# Patient Record
Sex: Male | Born: 1960
Health system: Southern US, Community
[De-identification: ages and names within clinical notes are randomized; demographics above are authoritative.]

## PROBLEM LIST (undated history)

## (undated) DIAGNOSIS — F329 Major depressive disorder, single episode, unspecified: Secondary | ICD-10-CM

## (undated) DIAGNOSIS — B1920 Unspecified viral hepatitis C without hepatic coma: Secondary | ICD-10-CM

## (undated) DIAGNOSIS — K219 Gastro-esophageal reflux disease without esophagitis: Secondary | ICD-10-CM

## (undated) DIAGNOSIS — F32A Depression, unspecified: Secondary | ICD-10-CM

## (undated) HISTORY — PX: KNEE SURGERY: SHX244

## (undated) HISTORY — PX: FOOT SURGERY: SHX648

---

## 2010-01-10 ENCOUNTER — Ambulatory Visit: Payer: Self-pay | Admitting: Diagnostic Radiology

## 2010-01-10 ENCOUNTER — Emergency Department (HOSPITAL_BASED_OUTPATIENT_CLINIC_OR_DEPARTMENT_OTHER): Admission: EM | Admit: 2010-01-10 | Discharge: 2010-01-10 | Payer: Self-pay | Admitting: Emergency Medicine

## 2010-01-19 ENCOUNTER — Emergency Department (HOSPITAL_COMMUNITY): Admission: EM | Admit: 2010-01-19 | Discharge: 2010-01-20 | Payer: Self-pay | Admitting: Emergency Medicine

## 2010-01-20 ENCOUNTER — Inpatient Hospital Stay (HOSPITAL_COMMUNITY)
Admission: EM | Admit: 2010-01-20 | Discharge: 2010-01-26 | Payer: Self-pay | Source: Home / Self Care | Admitting: Psychiatry

## 2010-01-20 ENCOUNTER — Ambulatory Visit: Payer: Self-pay | Admitting: Psychiatry

## 2010-01-20 DIAGNOSIS — F1910 Other psychoactive substance abuse, uncomplicated: Secondary | ICD-10-CM

## 2010-01-20 DIAGNOSIS — F1010 Alcohol abuse, uncomplicated: Secondary | ICD-10-CM

## 2010-05-11 LAB — DIFFERENTIAL
Eosinophils Relative: 2 % (ref 0–5)
Lymphocytes Relative: 36 % (ref 12–46)
Lymphs Abs: 3.5 10*3/uL (ref 0.7–4.0)
Monocytes Absolute: 0.7 10*3/uL (ref 0.1–1.0)

## 2010-05-11 LAB — POCT I-STAT, CHEM 8
BUN: 4 mg/dL — ABNORMAL LOW (ref 6–23)
Creatinine, Ser: 1.7 mg/dL — ABNORMAL HIGH (ref 0.4–1.5)
Sodium: 145 mEq/L (ref 135–145)
TCO2: 32 mmol/L (ref 0–100)

## 2010-05-11 LAB — CBC
HCT: 45.9 % (ref 39.0–52.0)
MCV: 95.7 fL (ref 78.0–100.0)
Platelets: 201 10*3/uL (ref 150–400)
RBC: 4.8 MIL/uL (ref 4.22–5.81)
WBC: 9.7 10*3/uL (ref 4.0–10.5)

## 2010-05-11 LAB — RAPID URINE DRUG SCREEN, HOSP PERFORMED: Barbiturates: NOT DETECTED

## 2010-05-11 LAB — HEPATIC FUNCTION PANEL
ALT: 30 U/L (ref 0–53)
AST: 32 U/L (ref 0–37)
Albumin: 3.5 g/dL (ref 3.5–5.2)
Bilirubin, Direct: 0.1 mg/dL (ref 0.0–0.3)

## 2010-05-11 LAB — RPR: RPR Ser Ql: NONREACTIVE

## 2012-01-23 ENCOUNTER — Observation Stay (HOSPITAL_BASED_OUTPATIENT_CLINIC_OR_DEPARTMENT_OTHER)
Admission: EM | Admit: 2012-01-23 | Discharge: 2012-01-24 | Disposition: A | Discharge status: Rehabilitation Facility | Payer: Self-pay | Attending: Emergency Medicine | Admitting: Emergency Medicine

## 2012-01-23 ENCOUNTER — Emergency Department (HOSPITAL_BASED_OUTPATIENT_CLINIC_OR_DEPARTMENT_OTHER): Payer: Self-pay

## 2012-01-23 ENCOUNTER — Encounter (HOSPITAL_BASED_OUTPATIENT_CLINIC_OR_DEPARTMENT_OTHER): Payer: Self-pay

## 2012-01-23 DIAGNOSIS — Z8701 Personal history of pneumonia (recurrent): Secondary | ICD-10-CM | POA: Insufficient documentation

## 2012-01-23 DIAGNOSIS — R911 Solitary pulmonary nodule: Secondary | ICD-10-CM | POA: Insufficient documentation

## 2012-01-23 DIAGNOSIS — R0602 Shortness of breath: Secondary | ICD-10-CM | POA: Insufficient documentation

## 2012-01-23 DIAGNOSIS — R079 Chest pain, unspecified: Secondary | ICD-10-CM

## 2012-01-23 DIAGNOSIS — Z79899 Other long term (current) drug therapy: Secondary | ICD-10-CM | POA: Insufficient documentation

## 2012-01-23 DIAGNOSIS — F17200 Nicotine dependence, unspecified, uncomplicated: Secondary | ICD-10-CM | POA: Insufficient documentation

## 2012-01-23 DIAGNOSIS — R0789 Other chest pain: Principal | ICD-10-CM | POA: Insufficient documentation

## 2012-01-23 DIAGNOSIS — R61 Generalized hyperhidrosis: Secondary | ICD-10-CM | POA: Insufficient documentation

## 2012-01-23 LAB — CBC WITH DIFFERENTIAL/PLATELET
Basophils Absolute: 0 10*3/uL (ref 0.0–0.1)
Basophils Relative: 0 % (ref 0–1)
Basophils Relative: 1 % (ref 0–1)
Eosinophils Absolute: 0.1 10*3/uL (ref 0.0–0.7)
Eosinophils Relative: 3 % (ref 0–5)
HCT: 44.7 % (ref 39.0–52.0)
Hemoglobin: 15.6 g/dL (ref 13.0–17.0)
Lymphocytes Relative: 6 % — ABNORMAL LOW (ref 12–46)
Lymphs Abs: 1.6 10*3/uL (ref 0.7–4.0)
MCH: 32 pg (ref 26.0–34.0)
MCHC: 33.3 g/dL (ref 30.0–36.0)
MCV: 78.1 fL (ref 78.0–100.0)
MCV: 96.1 fL (ref 78.0–100.0)
Monocytes Relative: 13 % — ABNORMAL HIGH (ref 3–12)
Neutro Abs: 6.9 10*3/uL (ref 1.7–7.7)
Neutrophils Relative %: 47 % (ref 43–77)
Neutrophils Relative %: 86 % — ABNORMAL HIGH (ref 43–77)
Platelets: 166 10*3/uL (ref 150–400)
RBC: 4.37 MIL/uL (ref 4.22–5.81)
RBC: 5.72 MIL/uL (ref 4.22–5.81)
RDW: 13.3 % (ref 11.5–15.5)
WBC: 8 10*3/uL (ref 4.0–10.5)

## 2012-01-23 LAB — COMPREHENSIVE METABOLIC PANEL
ALT: 13 U/L (ref 0–53)
AST: 20 U/L (ref 0–37)
Albumin: 3.8 g/dL (ref 3.5–5.2)
Albumin: 4.2 g/dL (ref 3.5–5.2)
BUN: 14 mg/dL (ref 6–23)
CO2: 26 mEq/L (ref 19–32)
Calcium: 9.5 mg/dL (ref 8.4–10.5)
Chloride: 105 mEq/L (ref 96–112)
GFR calc Af Amer: >90 mL/min (ref >90)
GFR calc non Af Amer: 69 mL/min — ABNORMAL LOW (ref >90)
Glucose, Bld: 102 mg/dL — ABNORMAL HIGH (ref 70–99)
Sodium: 141 mEq/L (ref 135–145)
Total Bilirubin: 0.4 mg/dL (ref 0.3–1.2)
Total Protein: 7.6 g/dL (ref 6.0–8.3)

## 2012-01-23 LAB — POCT I-STAT TROPONIN I
Troponin i, poc: 0 ng/mL (ref 0.00–0.08)
Troponin i, poc: 0 ng/mL (ref 0.00–0.08)

## 2012-01-23 LAB — TROPONIN I: Troponin I: <0.3 ng/mL (ref <0.30)

## 2012-01-23 MED ORDER — NICOTINE 21 MG/24HR TD PT24
21.0000 mg | MEDICATED_PATCH | Freq: Once | TRANSDERMAL | Status: AC
  Administered 2012-01-23: 21 mg via TRANSDERMAL
  Filled 2012-01-23: qty 1

## 2012-01-23 MED ORDER — ASPIRIN 81 MG PO CHEW
324.0000 mg | CHEWABLE_TABLET | Freq: Once | ORAL | Status: AC
  Administered 2012-01-23: 324 mg via ORAL
  Filled 2012-01-23: qty 4

## 2012-01-23 MED ORDER — NITROGLYCERIN 0.4 MG SL SUBL
0.4000 mg | SUBLINGUAL_TABLET | SUBLINGUAL | Status: DC | PRN
  Administered 2012-01-23: 0.4 mg via SUBLINGUAL
  Filled 2012-01-23: qty 25

## 2012-01-23 MED ORDER — TRAZODONE HCL 50 MG PO TABS
50.0000 mg | ORAL_TABLET | Freq: Every day | ORAL | Status: DC
  Administered 2012-01-23: 50 mg via ORAL
  Filled 2012-01-23: qty 1

## 2012-01-23 NOTE — ED Notes (Signed)
Report given to Alvino Chapel, RN [facility] CDU Room 12.

## 2012-01-23 NOTE — ED Notes (Signed)
Pt reports he was awakened with chest pain radiating to left arm and back.  He also reports SHOB and diaphoresis.

## 2012-01-23 NOTE — ED Notes (Signed)
Chart reviewed.

## 2012-01-23 NOTE — ED Notes (Signed)
Blood drawn on arrival to the ed

## 2012-01-23 NOTE — ED Notes (Signed)
The pt just arrived from med center where he has been since 0800am today. No chest pain since he received nitro sl at 100am today.  He had some fleeting chest pain last week that did not last very long and he came in today for chest pain more frequent and   Lasting longer.  On arival here vital good no chest pain .  He wants food and he wants to walk around for a few minutes to stretch his legs.  He has called the facility he is currently living and gave them his location.

## 2012-01-23 NOTE — ED Notes (Signed)
Called EMS to transport patient to [facility] CDU

## 2012-01-23 NOTE — ED Notes (Signed)
Dinner tray has arrived 

## 2012-01-23 NOTE — ED Provider Notes (Signed)
Assumed care of patient in the CDU.  Patient transferred from [facility] for Chest Pain Protocol.  Plan is for the patient to have a Stress Echo in the morning.   Patient began having chest pain last evening, which has been intermittent.  Pain associated with some SOB and diaphoresis.  No prior cardiac history.  No ischemic changes on EKG.  Initial and 3 hour Troponin are negative.  CXR did show a pulmonary nodule.  Discussed with patient and the recommendation for outpatient follow up.  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  He reports that he was treated with TB medications for 6 months.  Reassessed patient. Patient denies any chest pain at this time.  Patient alert and orientated x 3, Heart RRR, Lungs CTAB, Abdomen soft and nontender, No peripheral edema, DP 2+ bilaterally    8:00 PM Reassessed patient.  No acute distress.  No Chest Pain at this time.  11:59 PM Reassessed patient.  Patient is currently sleeping.  Patient will be signed out to Dr. Weldon Inches at shift change.  Plan is for the patient to have a Stress Echo in the morning.    Pascal Lux Clayton, PA-C 01/24/12 0002

## 2012-01-23 NOTE — ED Notes (Signed)
[place] sandwich given.  No chest pain.  Hot meal requested

## 2012-01-23 NOTE — ED Notes (Signed)
[facility] at bedside preparing for transport.  Pt stable upon transfer. 

## 2012-01-23 NOTE — ED Notes (Signed)
[facility]- notified for transfer to CDU--dr. Clarene Duke is receiving

## 2012-01-23 NOTE — ED Notes (Signed)
Pt watching tv.  No chest pain.  He reports a fleeting chest pain just awhile ago but it went away

## 2012-01-23 NOTE — ED Notes (Signed)
Pt resting comfortably in bed. Denies pain, SOB, diaphoresis.

## 2012-01-23 NOTE — ED Notes (Signed)
The pt has no chest pain decaf coffee given waiting for dinner tray

## 2012-01-23 NOTE — ED Notes (Signed)
Correction to the above statement.  His nitro was 1100am today

## 2012-01-23 NOTE — ED Notes (Signed)
Patient transported to X-ray via stretcher 

## 2012-01-23 NOTE — ED Notes (Signed)
MD at bedside. 

## 2012-01-23 NOTE — ED Notes (Signed)
Patient back from  X-ray 

## 2012-01-23 NOTE — ED Provider Notes (Signed)
History     CSN: [id number]  Arrival date & time 01/23/12  4098   First MD Initiated Contact with Patient 01/23/12 [phone]      Chief Complaint  Patient presents with  . Chest Pain    (Consider location/radiation/quality/duration/timing/severity/associated sxs/prior treatment) HPI Comments: Patient presents with intermittent chest pain that started last night. He states that the pain has been off and on since last night and has been waking him up from sleep. Describes it as a tightness to the left side of his chest radiating to his left arm. He's had some intermittent shortness of breath and diaphoresis associated with it. He denies any nausea or vomiting. He denies a history of heart problems. He did have a stress test in the past but it's been several years ago. This was done when he had some chest pain associated with the pneumonia. Denies any other history of heart problems. Denies a family history of heart disease. He does smoke cigarettes and has a history of alcohol and substance abuse. 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 He denies any exertional symptoms. Denies any pleuritic-type pain. He did recently start Wellbutrin approximately one week ago.  Patient is a 51 y.o. male presenting with chest pain.  Chest Pain Primary symptoms include shortness of breath. Pertinent negatives for primary symptoms include no fever, no fatigue, no cough, no abdominal pain, no nausea, no vomiting and no dizziness.  Associated symptoms include diaphoresis.  Pertinent negatives for associated symptoms include no numbness and no weakness.     History reviewed. No pertinent past medical history.  Past Surgical History  Procedure Date  . Knee surgery     No family history on file.  History  Substance Use Topics  . Smoking status: Current Every Day Smoker -- 1.0 packs/day  . Smokeless tobacco: Not on file  . Alcohol Use: Yes     Comment:  in recovery- last drink was 15 days ago      Review of Systems  Constitutional: Positive for diaphoresis. Negative for fever, chills and fatigue.  HENT: Negative for congestion, rhinorrhea and sneezing.   Eyes: Negative.   Respiratory: Positive for shortness of breath. Negative for cough and chest tightness.   Cardiovascular: Positive for chest pain. Negative for leg swelling.  Gastrointestinal: Negative for nausea, vomiting, abdominal pain, diarrhea and blood in stool.  Genitourinary: Negative for frequency, hematuria, flank pain and difficulty urinating.  Musculoskeletal: Negative for back pain and arthralgias.  Skin: Negative for rash.  Neurological: Negative for dizziness, speech difficulty, weakness, numbness and headaches.    Allergies  Review of patient's allergies indicates no known allergies.  Home Medications   Current Outpatient Rx  Name  Route  Sig  Dispense  Refill  . BUPROPION HCL ER (XL) 150 MG PO TB24   Oral   Take 150 mg by mouth daily.         Marland Kitchen OMEPRAZOLE 20 MG PO CPDR   Oral   Take 20 mg by mouth daily.         . TRAZODONE HCL PO   Oral   Take 50 mg by mouth.           BP 110/67  Pulse 87  Resp 12  Ht 5\' 9"  (1.753 m)  Wt 160 lb (72.576 kg)  BMI 23.63 kg/m2  SpO2 99%  Physical Exam  Constitutional: He is oriented to person, place, and time. He appears well-developed and  well-nourished.  HENT:  Head: Normocephalic and atraumatic.  Eyes: Pupils are equal, round, and reactive to light.  Neck: Normal range of motion. Neck supple.  Cardiovascular: Normal rate, regular rhythm and normal heart sounds.   Pulmonary/Chest: Effort normal and breath sounds normal. No respiratory distress. He has no wheezes. He has no rales. He exhibits no tenderness.  Abdominal: Soft. Bowel sounds are normal. There is no tenderness. There is no rebound and no guarding.  Musculoskeletal: Normal range of motion. He exhibits no edema and no tenderness.    Lymphadenopathy:    He has no cervical adenopathy.  Neurological: He is alert and oriented to person, place, and time.  Skin: Skin is warm and dry. No rash noted.  Psychiatric: He has a normal mood and affect.    ED Course  Procedures (including critical care time)   Date: 01/23/2012  Rate: 96  Rhythm: normal sinus rhythm  QRS Axis: normal  Intervals: normal  ST/T Wave abnormalities: normal  Conduction Disutrbances:none  Narrative Interpretation:   Old EKG Reviewed: none available    Results for orders placed during the hospital encounter of 01/23/12  CBC WITH DIFFERENTIAL      Component Value Range   WBC 8.0  4.0 - 10.5 K/uL   RBC 5.72  4.22 - 5.81 MIL/uL   Hemoglobin 15.6  13.0 - 17.0 g/dL   HCT 16.1  09.6 - 04.5 %   MCV 78.1  78.0 - 100.0 fL   MCH 27.3  26.0 - 34.0 pg   MCHC 34.9  30.0 - 36.0 g/dL   RDW 40.9  81.1 - 91.4 %   Platelets 238  150 - 400 K/uL   Neutrophils Relative 86 (*) 43 - 77 %   Neutro Abs 6.9  1.7 - 7.7 K/uL   Lymphocytes Relative 6 (*) 12 - 46 %   Lymphs Abs 0.5 (*) 0.7 - 4.0 K/uL   Monocytes Relative 6  3 - 12 %   Monocytes Absolute 0.5  0.1 - 1.0 K/uL   Eosinophils Relative 2  0 - 5 %   Eosinophils Absolute 0.2  0.0 - 0.7 K/uL   Basophils Relative 0  0 - 1 %   Basophils Absolute 0.0  0.0 - 0.1 K/uL  COMPREHENSIVE METABOLIC PANEL      Component Value Range   Sodium 141  135 - 145 mEq/L   Potassium 4.6  3.5 - 5.1 mEq/L   Chloride 105  96 - 112 mEq/L   CO2 26  19 - 32 mEq/L   Glucose, Bld 103 (*) 70 - 99 mg/dL   BUN 13  6 - 23 mg/dL   Creatinine, Ser 7.82  0.50 - 1.35 mg/dL   Calcium 9.5  8.4 - 95.6 mg/dL   Total Protein 7.6  6.0 - 8.3 g/dL   Albumin 4.2  3.5 - 5.2 g/dL   AST 20  0 - 37 U/L   ALT 13  0 - 53 U/L   Alkaline Phosphatase 64  39 - 117 U/L   Total Bilirubin 0.4  0.3 - 1.2 mg/dL   GFR calc non Af Amer 69 (*) >90 mL/min   GFR calc Af Amer 80 (*) >90 mL/min  TROPONIN I      Component Value Range   Troponin I <0.30  <0.30  ng/mL   Dg Chest 2 View  01/23/2012  *RADIOLOGY REPORT*  Clinical Data: Chest pain.  CHEST - 2 VIEW  Comparison: None.  Findings: Cardiomediastinal silhouette  appears normal.  Right lung is clear.  Ill-defined nodular density is noted and lateral portion of left upper lobe; CT scan of chest is recommended for further evaluation.  IMPRESSION: Ill-defined nodular density is seen in left upper lobe; CT scan of chest is recommended for further evaluation and to rule out potential neoplasm.   Original Report Authenticated By: Lupita Raider.,  M.D.       1. Chest pain   2. Lung nodule       MDM  Pt pain free after nitro.  Given ASA.  No ekg changes, first trop negative.  Will transfer to CDU for chest pain protocol.  Discussed with Dr. Clarene Duke and MLP in CDU.  Will need outpt f/u of lung nodule.        Rolan Bucco, MD 01/23/12 1057

## 2012-01-23 NOTE — ED Notes (Signed)
Report called to [facility], RN 

## 2012-01-23 NOTE — ED Notes (Signed)
Cancelled the call to EMS for transfer

## 2012-01-24 DIAGNOSIS — R072 Precordial pain: Secondary | ICD-10-CM

## 2012-01-24 MED ORDER — PANTOPRAZOLE SODIUM 20 MG PO TBEC: 20.0000 mg | DELAYED_RELEASE_TABLET | Freq: Every day | ORAL | Status: DC

## 2012-01-24 MED ORDER — BUPROPION HCL ER (XL) 150 MG PO TB24
150.0000 mg | ORAL_TABLET | Freq: Every day | ORAL | Status: DC
  Administered 2012-01-24: 150 mg via ORAL
  Filled 2012-01-24: qty 1

## 2012-01-24 MED ORDER — PANTOPRAZOLE SODIUM 40 MG PO TBEC
40.0000 mg | DELAYED_RELEASE_TABLET | Freq: Every day | ORAL | Status: DC
  Administered 2012-01-24: 40 mg via ORAL
  Filled 2012-01-24: qty 1

## 2012-01-24 NOTE — ED Provider Notes (Signed)
Patient moved to CDU under chest pain protocol.  While in obeservation over night the pt slept well and had no complaints, per nursing staff.  Patient resting comfortably at present without return of chest pain. Lungs CTA bilaterally. S1/S2, RRR, no murmur. Abdomen soft, bowel sounds present. Strong distal pulses palpated all extremities. Sinus rhythm on monitor without ectopy. Troponin negative x 2. 12 lead reviewed, no indication of ischemia. Patient scheduled for Stress Echo this AM. Diagnostic and treatment plan discussed with patient.  10:50 AM Stress echo results: Left ventricular ejection fraction was normal at rest and with stress. Normal echo stress.  I discussed these findings with the patient    Patient to be discharged home with followup with [facility].  Strict return instructions given if chest pain returns.  I have also discussed reasons to return immediately to the ER.  Patient expresses understanding and agrees with plan.  1. Medications: protonix 2. Treatment: rest, drink plenty of fluids 3. Follow Up: with [facility] this week  Dierdre Forth, PA-C 01/24/12 1610

## 2012-01-24 NOTE — Progress Notes (Signed)
  Echocardiogram Echocardiogram Stress Test has been performed.  Caleb Dyer 01/24/2012, 9:26 AM

## 2012-01-24 NOTE — ED Notes (Signed)
SPOKE WITH NURSE AT [facility] AND SHE ADVISES IT IS OK FOR PT TO GO SIT IN THE LOBBY WHILE HE IS WAITING FOR THEIR STAFF TO COME PICK HIM UP

## 2012-01-25 NOTE — ED Provider Notes (Signed)
Pt sent by [facility] EDP to cdu on cp protocol.   Suzi Roots, MD 01/25/12 [phone]

## 2012-01-25 NOTE — ED Provider Notes (Signed)
Pt seen by PAC under obs protocol.  I was available for consultation or discussion.  Caleb Dyer. Oletta Lamas, MD 01/25/12 [phone]

## 2012-02-19 ENCOUNTER — Emergency Department (HOSPITAL_BASED_OUTPATIENT_CLINIC_OR_DEPARTMENT_OTHER)
Admission: EM | Admit: 2012-02-19 | Discharge: 2012-02-19 | Disposition: A | Discharge status: Home / Self Care | Payer: Self-pay | Attending: Emergency Medicine | Admitting: Emergency Medicine

## 2012-02-19 ENCOUNTER — Encounter (HOSPITAL_BASED_OUTPATIENT_CLINIC_OR_DEPARTMENT_OTHER): Payer: Self-pay | Admitting: *Deleted

## 2012-02-19 DIAGNOSIS — F17200 Nicotine dependence, unspecified, uncomplicated: Secondary | ICD-10-CM | POA: Insufficient documentation

## 2012-02-19 DIAGNOSIS — H5789 Other specified disorders of eye and adnexa: Secondary | ICD-10-CM | POA: Insufficient documentation

## 2012-02-19 DIAGNOSIS — H109 Unspecified conjunctivitis: Secondary | ICD-10-CM | POA: Insufficient documentation

## 2012-02-19 MED ORDER — SULFACETAMIDE SODIUM 10 % OP SOLN: 2.0000 [drp] | OPHTHALMIC | Status: DC

## 2012-02-19 MED ORDER — SULFACETAMIDE SODIUM 10 % OP SOLN: 2.0000 [drp] | Freq: Four times a day (QID) | OPHTHALMIC | Status: DC

## 2012-02-19 MED ORDER — FLUORESCEIN SODIUM 1 MG OP STRP
ORAL_STRIP | OPHTHALMIC | Status: AC
  Administered 2012-02-19: 20:00:00
  Filled 2012-02-19: qty 1

## 2012-02-19 MED ORDER — TETRACAINE HCL 0.5 % OP SOLN
OPHTHALMIC | Status: AC
  Administered 2012-02-19: 20:00:00
  Filled 2012-02-19: qty 2

## 2012-02-19 NOTE — ED Notes (Signed)
Right eye pain since Friday. "Felt like something was in eye." Now red and puffy.

## 2012-02-19 NOTE — ED Notes (Signed)
rx x 1 given at d/c-

## 2012-02-19 NOTE — ED Provider Notes (Signed)
History  This chart was scribed for Geoffery Lyons, MD by Shari Heritage, ED Scribe. The patient was seen in room MH12/MH12. Patient's care was started at 18.  CSN: [id number]  Arrival date & time 02/19/12  4098   First MD Initiated Contact with Patient 02/19/12 1908      Chief Complaint  Patient presents with  . Eye Pain    The history is provided by the patient. No language interpreter was used.    HPI Comments: Caleb Dyer is a 51 y.o. male who presents to the Emergency Department complaining of gradually worsening, constant, non-radiating right eye pain onset 2 days ago. Patient has noticed some matting in the mornings to the right eye. There is also associated redness and swelling to the right eye. Patient wears reading glasses. He denies any injuries or traumas to the eye or head. Patient denies photophobia or vision changes. He denies any sick contacts. 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 Patient is a current every day smokers.    History reviewed. No pertinent past medical history.  Past Surgical History  Procedure Date  . Knee surgery     History reviewed. No pertinent family history.  History  Substance Use Topics  . Smoking status: Current Every Day Smoker -- 1.0 packs/day  . Smokeless tobacco: Not on file  . Alcohol Use: Yes     Comment: in recovery- last drink was 15 days ago      Review of Systems  Eyes: Positive for pain and redness. Negative for photophobia and visual disturbance.  All other systems reviewed and are negative.    Allergies  Review of patient's allergies indicates no known allergies.  Home Medications   Current Outpatient Rx  Name  Route  Sig  Dispense  Refill  . BUPROPION HCL ER (XL) 150 MG PO TB24   Oral   Take 150 mg by mouth daily.         Marland Kitchen OMEPRAZOLE 20 MG PO CPDR   Oral   Take 20 mg by mouth daily.         Marland Kitchen PANTOPRAZOLE SODIUM 20 MG PO  TBEC   Oral   Take 1 tablet (20 mg total) by mouth daily.   30 tablet   0   . TRAZODONE HCL PO   Oral   Take 50 mg by mouth daily as needed. sleep           Triage Vitals: BP 113/72  Pulse 94  Temp 99.6 F (37.6 C) (Oral)  Resp 20  Ht 5\' 9"  (1.753 m)  Wt 160 lb (72.576 kg)  BMI 23.63 kg/m2  SpO2 98%  Physical Exam  Constitutional: He is oriented to person, place, and time. He appears well-developed and well-nourished. No distress.  HENT:  Head: Normocephalic and atraumatic.  Eyes: EOM are normal. Pupils are equal, round, and reactive to light. Right conjunctiva is injected.       Cornea is clear without abrasions to both visual inspection and fluorescein staining.   Neck: Neck supple.  Cardiovascular: Normal rate and regular rhythm.   No murmur heard. Pulmonary/Chest: Effort normal and breath sounds normal. No respiratory distress. He has no wheezes. He has no rales.  Abdominal: Soft. Bowel sounds are normal. He exhibits no distension. There is no tenderness.  Musculoskeletal: Normal range of motion. He exhibits no edema and no tenderness.  Neurological: He is alert and oriented  to person, place, and time.  Skin: Skin is warm and dry. No rash noted.    ED Course  Procedures (including critical care time) DIAGNOSTIC STUDIES: Oxygen Saturation is 98% on room air, normal by my interpretation.    COORDINATION OF CARE: 7:14 PM- Patient informed of current plan for treatment and evaluation and agrees with plan at this time.    No diagnosis found.    MDM  No abrasions seen with fluorescein and no fb's under the lids.  There is conjunctival injection and I believe this is conjunctivitis.  Will treat with bleph 10.  Return prn.      I personally performed the services described in this documentation, which was scribed in my presence. The recorded information has been reviewed and is accurate.      Geoffery Lyons, MD 02/19/12 2110

## 2012-03-19 ENCOUNTER — Encounter (HOSPITAL_BASED_OUTPATIENT_CLINIC_OR_DEPARTMENT_OTHER): Payer: Self-pay | Admitting: Emergency Medicine

## 2012-03-19 ENCOUNTER — Emergency Department (HOSPITAL_BASED_OUTPATIENT_CLINIC_OR_DEPARTMENT_OTHER)
Admission: EM | Admit: 2012-03-19 | Discharge: 2012-03-19 | Disposition: A | Discharge status: Rehabilitation Facility | Payer: Self-pay | Attending: Emergency Medicine | Admitting: Emergency Medicine

## 2012-03-19 DIAGNOSIS — F17200 Nicotine dependence, unspecified, uncomplicated: Secondary | ICD-10-CM | POA: Insufficient documentation

## 2012-03-19 DIAGNOSIS — F3289 Other specified depressive episodes: Secondary | ICD-10-CM | POA: Insufficient documentation

## 2012-03-19 DIAGNOSIS — F329 Major depressive disorder, single episode, unspecified: Secondary | ICD-10-CM | POA: Insufficient documentation

## 2012-03-19 DIAGNOSIS — K219 Gastro-esophageal reflux disease without esophagitis: Secondary | ICD-10-CM | POA: Insufficient documentation

## 2012-03-19 DIAGNOSIS — Z8619 Personal history of other infectious and parasitic diseases: Secondary | ICD-10-CM | POA: Insufficient documentation

## 2012-03-19 DIAGNOSIS — Z79899 Other long term (current) drug therapy: Secondary | ICD-10-CM | POA: Insufficient documentation

## 2012-03-19 DIAGNOSIS — F1010 Alcohol abuse, uncomplicated: Secondary | ICD-10-CM | POA: Insufficient documentation

## 2012-03-19 DIAGNOSIS — J111 Influenza due to unidentified influenza virus with other respiratory manifestations: Secondary | ICD-10-CM | POA: Insufficient documentation

## 2012-03-19 DIAGNOSIS — R509 Fever, unspecified: Secondary | ICD-10-CM | POA: Insufficient documentation

## 2012-03-19 DIAGNOSIS — F1110 Opioid abuse, uncomplicated: Secondary | ICD-10-CM | POA: Insufficient documentation

## 2012-03-19 HISTORY — DX: Major depressive disorder, single episode, unspecified: F32.9

## 2012-03-19 HISTORY — DX: Gastro-esophageal reflux disease without esophagitis: K21.9

## 2012-03-19 HISTORY — DX: Depression, unspecified: F32.A

## 2012-03-19 MED ORDER — OSELTAMIVIR PHOSPHATE 75 MG PO CAPS: 75.0000 mg | ORAL_CAPSULE | Freq: Two times a day (BID) | ORAL | Status: DC

## 2012-03-19 MED ORDER — SALINE SPRAY 0.65 % NA SOLN
1.0000 | Freq: Once | NASAL | Status: AC
  Administered 2012-03-19: 1 via NASAL
  Filled 2012-03-19: qty 44

## 2012-03-19 MED ORDER — OXYMETAZOLINE HCL 0.05 % NA SOLN: 2.0000 | Freq: Two times a day (BID) | NASAL | Status: DC

## 2012-03-19 MED ORDER — IBUPROFEN 400 MG PO TABS
600.0000 mg | ORAL_TABLET | Freq: Once | ORAL | Status: AC
  Administered 2012-03-19: 600 mg via ORAL
  Filled 2012-03-19: qty 1

## 2012-03-19 MED ORDER — SALINE NASAL SPRAY 0.65 % NA SOLN: 1.0000 | NASAL | Status: DC | PRN

## 2012-03-19 MED ORDER — IBUPROFEN 600 MG PO TABS: 600.0000 mg | ORAL_TABLET | Freq: Four times a day (QID) | ORAL | Status: DC | PRN

## 2012-03-19 NOTE — ED Provider Notes (Signed)
History     CSN: [id number]  Arrival date & time 03/19/12  0900   First MD Initiated Contact with Patient 03/19/12 0920      Chief Complaint  Patient presents with  . Cough  . Fever    (Consider location/radiation/quality/duration/timing/severity/associated sxs/prior treatment) HPI Comments: Pt comes from [facility] facility, where he is rehabiliting for heroine and alcohol abuse. Pt has hx of hep C, no other significant medical problems. Pt states that since y'day, he has been having cough, subjective fevers, chills, and myalgias. He also has some chest discomfort with the cough, and nasal congestion with post nasal drip. He is at a facility, where several people are being treated for flu - and several people are isolated due to flu.    Patient is a 52 y.o. male presenting with cough and fever. The history is provided by the patient.  Cough Associated symptoms include chills and sore throat. Pertinent negatives include no chest pain, no rhinorrhea and no shortness of breath.  Fever Primary symptoms of the febrile illness include fever, fatigue and cough. Primary symptoms do not include shortness of breath, abdominal pain or dysuria.    Past Medical History  Diagnosis Date  . Depression   . GERD (gastroesophageal reflux disease)     Past Surgical History  Procedure Date  . Knee surgery     No family history on file.  History  Substance Use Topics  . Smoking status: Current Every Day Smoker -- 1.0 packs/day  . Smokeless tobacco: Not on file  . Alcohol Use: Yes     Comment: in recovery- last drink was 66 days ago      Review of Systems  Constitutional: Positive for fever, chills and fatigue. Negative for activity change and appetite change.  HENT: Positive for congestion, sore throat and postnasal drip. Negative for rhinorrhea.   Respiratory: Positive for cough. Negative for shortness of breath.   Cardiovascular: Negative for chest pain.  Gastrointestinal:  Negative for abdominal pain.  Genitourinary: Negative for dysuria.    Allergies  Review of patient's allergies indicates no known allergies.  Home Medications   Current Outpatient Rx  Name  Route  Sig  Dispense  Refill  . BUPROPION HCL ER (XL) 150 MG PO TB24   Oral   Take 150 mg by mouth daily.         Marland Kitchen OMEPRAZOLE 20 MG PO CPDR   Oral   Take 20 mg by mouth daily.         Marland Kitchen PANTOPRAZOLE SODIUM 20 MG PO TBEC   Oral   Take 1 tablet (20 mg total) by mouth daily.   30 tablet   0   . SULFACETAMIDE SODIUM 10 % OP SOLN   Both Eyes   Place 2 drops into both eyes every 4 (four) hours.   5 mL   0   . TRAZODONE HCL PO   Oral   Take 50 mg by mouth daily as needed. sleep           BP 128/79  Pulse 98  Temp 98 F (36.7 C) (Oral)  Resp 16  Ht 5\' 9"  (1.753 m)  Wt 157 lb (71.215 kg)  BMI 23.18 kg/m2  SpO2 98%  Physical Exam  Nursing note and vitals reviewed. Constitutional: He is oriented to person, place, and time. He appears well-developed.  HENT:  Head: Normocephalic and atraumatic.  Mouth/Throat: No oropharyngeal exudate.  Eyes: Conjunctivae normal and EOM are normal. Pupils are  equal, round, and reactive to light.  Neck: Normal range of motion. Neck supple.  Cardiovascular: Normal rate and regular rhythm.   Pulmonary/Chest: Effort normal and breath sounds normal.  Abdominal: Soft. Bowel sounds are normal. He exhibits no distension. There is no tenderness. There is no rebound and no guarding.  Lymphadenopathy:    He has cervical adenopathy.  Neurological: He is alert and oriented to person, place, and time.  Skin: Skin is warm.    ED Course  Procedures (including critical care time)  Labs Reviewed - No data to display No results found.   No diagnosis found.    MDM  Pt comes in with cc of what appears to be viral syndrome like sx or flu.  Pt is at a rehab center, and states that there are several cases of flu over there, and thus is at high risk  for contracting flu. He has no significant medical hx. He is within the window of therapy. Will give him the script for tamiflu.   Derwood Kaplan, MD 03/19/12 [phone]

## 2012-03-19 NOTE — ED Notes (Signed)
Cough, low grade fever, sore throat since yesterday.  Current inpt. at [facility].

## 2012-03-30 ENCOUNTER — Emergency Department (HOSPITAL_BASED_OUTPATIENT_CLINIC_OR_DEPARTMENT_OTHER)
Admission: EM | Admit: 2012-03-30 | Discharge: 2012-03-30 | Disposition: A | Discharge status: Home / Self Care | Payer: Self-pay | Attending: Emergency Medicine | Admitting: Emergency Medicine

## 2012-03-30 ENCOUNTER — Emergency Department (HOSPITAL_BASED_OUTPATIENT_CLINIC_OR_DEPARTMENT_OTHER): Payer: Self-pay

## 2012-03-30 ENCOUNTER — Encounter (HOSPITAL_BASED_OUTPATIENT_CLINIC_OR_DEPARTMENT_OTHER): Payer: Self-pay | Admitting: Family Medicine

## 2012-03-30 DIAGNOSIS — F17200 Nicotine dependence, unspecified, uncomplicated: Secondary | ICD-10-CM | POA: Insufficient documentation

## 2012-03-30 DIAGNOSIS — R1013 Epigastric pain: Secondary | ICD-10-CM | POA: Insufficient documentation

## 2012-03-30 DIAGNOSIS — J069 Acute upper respiratory infection, unspecified: Secondary | ICD-10-CM | POA: Insufficient documentation

## 2012-03-30 DIAGNOSIS — Z79899 Other long term (current) drug therapy: Secondary | ICD-10-CM | POA: Insufficient documentation

## 2012-03-30 DIAGNOSIS — F329 Major depressive disorder, single episode, unspecified: Secondary | ICD-10-CM | POA: Insufficient documentation

## 2012-03-30 DIAGNOSIS — F3289 Other specified depressive episodes: Secondary | ICD-10-CM | POA: Insufficient documentation

## 2012-03-30 DIAGNOSIS — K219 Gastro-esophageal reflux disease without esophagitis: Secondary | ICD-10-CM | POA: Insufficient documentation

## 2012-03-30 DIAGNOSIS — J3489 Other specified disorders of nose and nasal sinuses: Secondary | ICD-10-CM | POA: Insufficient documentation

## 2012-03-30 DIAGNOSIS — Z8619 Personal history of other infectious and parasitic diseases: Secondary | ICD-10-CM | POA: Insufficient documentation

## 2012-03-30 DIAGNOSIS — K2970 Gastritis, unspecified, without bleeding: Secondary | ICD-10-CM | POA: Insufficient documentation

## 2012-03-30 DIAGNOSIS — K2990 Gastroduodenitis, unspecified, without bleeding: Secondary | ICD-10-CM | POA: Insufficient documentation

## 2012-03-30 HISTORY — DX: Unspecified viral hepatitis C without hepatic coma: B19.20

## 2012-03-30 MED ORDER — BENZONATATE 100 MG PO CAPS: 100.0000 mg | ORAL_CAPSULE | Freq: Three times a day (TID) | ORAL | Status: DC

## 2012-03-30 MED ORDER — GI COCKTAIL ~~LOC~~
30.0000 mL | Freq: Once | ORAL | Status: AC
  Administered 2012-03-30: 30 mL via ORAL
  Filled 2012-03-30: qty 30

## 2012-03-30 MED ORDER — PANTOPRAZOLE SODIUM 40 MG PO TBEC: 40.0000 mg | DELAYED_RELEASE_TABLET | Freq: Every day | ORAL | Status: DC

## 2012-03-30 MED ORDER — FLUTICASONE PROPIONATE 50 MCG/ACT NA SUSP: 2.0000 | Freq: Every day | NASAL | Status: DC

## 2012-03-30 NOTE — ED Notes (Addendum)
Pt is in [facility] rehab. Pt c/o cough persistent for a couple of weeks. Pt reports recently having the flu. Pt also c/o sinus congestion and headache. Pt requesting change in reflux med.

## 2012-03-30 NOTE — ED Provider Notes (Signed)
History     CSN: [id number]  Arrival date & time 03/30/12  4098   First MD Initiated Contact with Patient 03/30/12 [phone]      Chief Complaint  Patient presents with  . Cough    (Consider location/radiation/quality/duration/timing/severity/associated sxs/prior treatment) Patient is a 52 y.o. male presenting with cough.  Cough Pertinent negatives include no chest pain, no chills, no headaches, no sore throat, no shortness of breath and no wheezing.   Pt is in [facility] for alcohol and drug abuse and present with multiple complaints. He has persistent cough without sputum production  For several weeks. Was treated for flu 1 month ago without improvement of symptoms. Muliple sick contacts with similar illness. Pt also c/o nasal congestion and frontal sinus pressure and is taking afrin. No visual changes.   Pt has had increased symptoms due to his ongoing GERD. +burning pain in epigastrium that radiates up esophagus after he eat. He takes protonix 20 mg every day. States he has been compliant. He ate large breakfast and is now having epigastric pain. Asks to have his protonix increased. Never seen GI.   Past Medical History  Diagnosis Date  . Depression   . GERD (gastroesophageal reflux disease)   . Hepatitis C     Past Surgical History  Procedure Date  . Knee surgery     No family history on file.  History  Substance Use Topics  . Smoking status: Current Every Day Smoker -- 1.0 packs/day  . Smokeless tobacco: Not on file  . 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  HENT: Positive for congestion and sinus pressure. Negative for sore throat.   Respiratory: Positive for cough. Negative for shortness of breath and wheezing.   Cardiovascular: Negative for chest pain.  Gastrointestinal: Positive for abdominal pain. Negative for nausea, vomiting, diarrhea and constipation.   Musculoskeletal: Negative for back pain.  Skin: Negative for rash and wound.  Neurological: Negative for dizziness, light-headedness, numbness and headaches.  All other systems reviewed and are negative.    Allergies  Review of patient's allergies indicates no known allergies.  Home Medications   Current Outpatient Rx  Name  Route  Sig  Dispense  Refill  . OSELTAMIVIR PHOSPHATE 75 MG PO CAPS   Oral   Take 1 capsule (75 mg total) by mouth every 12 (twelve) hours.   14 capsule   0   . BUPROPION HCL ER (XL) 150 MG PO TB24   Oral   Take 150 mg by mouth daily.         . IBUPROFEN 600 MG PO TABS   Oral   Take 1 tablet (600 mg total) by mouth every 6 (six) hours as needed for pain.   30 tablet   0   . OMEPRAZOLE 20 MG PO CPDR   Oral   Take 20 mg by mouth daily.         Marland Kitchen OXYMETAZOLINE HCL 0.05 % NA SOLN   Nasal   Place 2 sprays into the nose 2 (two) times daily.   15 mL   0   . PANTOPRAZOLE SODIUM 20 MG PO TBEC   Oral   Take 1 tablet (20 mg total) by mouth daily.   30 tablet   0   . SALINE NASAL SPRAY 0.65 % NA SOLN   Nasal   Place 1  spray into the nose as needed for congestion.   30 mL   12   . SULFACETAMIDE SODIUM 10 % OP SOLN   Both Eyes   Place 2 drops into both eyes every 4 (four) hours.   5 mL   0   . TRAZODONE HCL PO   Oral   Take 50 mg by mouth daily as needed. sleep           BP 119/78  Pulse 83  Temp 97.9 F (36.6 C) (Oral)  Resp 16  Ht 5\' 9"  (1.753 m)  Wt 155 lb (70.308 kg)  BMI 22.89 kg/m2  SpO2 95%  Physical Exam  Nursing note and vitals reviewed. Constitutional: He is oriented to person, place, and time. He appears well-developed and well-nourished. No distress.  HENT:  Head: Normocephalic and atraumatic.  Nose: Mucosal edema present. Right sinus exhibits frontal sinus tenderness. Right sinus exhibits no maxillary sinus tenderness. Left sinus exhibits frontal sinus tenderness. Left sinus exhibits no maxillary sinus  tenderness.  Mouth/Throat: Oropharynx is clear and moist. No oropharyngeal exudate.  Eyes: EOM are normal. Pupils are equal, round, and reactive to light.  Neck: Normal range of motion. Neck supple.  Cardiovascular: Normal rate and regular rhythm.   Pulmonary/Chest: Effort normal and breath sounds normal. No respiratory distress. He has no wheezes. He has no rales.  Abdominal: Soft. Bowel sounds are normal. He exhibits no distension and no mass. There is tenderness (epigastic TTP). There is no rebound and no guarding.  Musculoskeletal: Normal range of motion. He exhibits no edema and no tenderness.  Neurological: He is alert and oriented to person, place, and time.  Skin: Skin is warm and dry. No rash noted. No erythema.  Psychiatric: He has a normal mood and affect. His behavior is normal.    ED Course  Procedures (including critical care time)  Labs Reviewed - No data to display Dg Chest 2 View  03/30/2012  *RADIOLOGY REPORT*  Clinical Data: Cough  CHEST - 2 VIEW  Comparison: 01/23/2012  Findings: Cardiomediastinal silhouette is stable.  Again noted spiculated nodular density in the left upper lobe measures at least 1.3 cm.  Further evaluation with CT scan of the chest is recommended to exclude malignancy.  No acute infiltrate or pulmonary edema.  IMPRESSION: Again noted spiculated nodular density in the left upper lobe measures at least 1.3 cm.  Further evaluation with CT scan of the chest is recommended to exclude malignancy.  No acute infiltrate or pulmonary edema.   Original Report Authenticated By: Natasha Mead, M.D.      1. URI, acute   2. 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  Pt admits to taking ibuprofen 600 mg BID. Advised to stop taking all NSAID's.   Will increase protonix and treat pt's cough and frontal sinusitis.        Loren Racer, MD 03/30/12 1050

## 2013-12-30 ENCOUNTER — Emergency Department (HOSPITAL_BASED_OUTPATIENT_CLINIC_OR_DEPARTMENT_OTHER): Payer: Self-pay

## 2013-12-30 ENCOUNTER — Emergency Department (HOSPITAL_BASED_OUTPATIENT_CLINIC_OR_DEPARTMENT_OTHER)
Admission: EM | Admit: 2013-12-30 | Discharge: 2013-12-30 | Disposition: A | Discharge status: Home / Self Care | Payer: Self-pay | Attending: Emergency Medicine | Admitting: Emergency Medicine

## 2013-12-30 ENCOUNTER — Encounter (HOSPITAL_BASED_OUTPATIENT_CLINIC_OR_DEPARTMENT_OTHER): Payer: Self-pay

## 2013-12-30 DIAGNOSIS — Z79899 Other long term (current) drug therapy: Secondary | ICD-10-CM | POA: Insufficient documentation

## 2013-12-30 DIAGNOSIS — W1809XA Striking against other object with subsequent fall, initial encounter: Secondary | ICD-10-CM | POA: Insufficient documentation

## 2013-12-30 DIAGNOSIS — Z720 Tobacco use: Secondary | ICD-10-CM | POA: Insufficient documentation

## 2013-12-30 DIAGNOSIS — Z9889 Other specified postprocedural states: Secondary | ICD-10-CM | POA: Insufficient documentation

## 2013-12-30 DIAGNOSIS — S2232XA Fracture of one rib, left side, initial encounter for closed fracture: Secondary | ICD-10-CM | POA: Insufficient documentation

## 2013-12-30 DIAGNOSIS — Y9389 Activity, other specified: Secondary | ICD-10-CM | POA: Insufficient documentation

## 2013-12-30 DIAGNOSIS — F329 Major depressive disorder, single episode, unspecified: Secondary | ICD-10-CM | POA: Insufficient documentation

## 2013-12-30 DIAGNOSIS — W19XXXA Unspecified fall, initial encounter: Secondary | ICD-10-CM

## 2013-12-30 DIAGNOSIS — Y9289 Other specified places as the place of occurrence of the external cause: Secondary | ICD-10-CM | POA: Insufficient documentation

## 2013-12-30 DIAGNOSIS — K219 Gastro-esophageal reflux disease without esophagitis: Secondary | ICD-10-CM | POA: Insufficient documentation

## 2013-12-30 DIAGNOSIS — Z8619 Personal history of other infectious and parasitic diseases: Secondary | ICD-10-CM | POA: Insufficient documentation

## 2013-12-30 DIAGNOSIS — S8011XA Contusion of right lower leg, initial encounter: Secondary | ICD-10-CM | POA: Insufficient documentation

## 2013-12-30 DIAGNOSIS — Z7251 High risk heterosexual behavior: Secondary | ICD-10-CM | POA: Insufficient documentation

## 2013-12-30 MED ORDER — HYDROCODONE-ACETAMINOPHEN 5-325 MG PO TABS: 1.0000 | ORAL_TABLET | Freq: Four times a day (QID) | ORAL | Status: DC | PRN

## 2013-12-30 NOTE — ED Notes (Signed)
Pt using crutches from recent foot surgery-fell 4 days ago-pain to left rib area and right leg-pt has one crutch and post op shoe on right foot upon arrival to triage

## 2013-12-30 NOTE — ED Provider Notes (Signed)
CSN: [id number]     Arrival date & time 12/30/13  1422 History   First MD Initiated Contact with Patient 12/30/13 1438     Chief Complaint  Patient presents with  . Fall     (Consider location/radiation/quality/duration/timing/severity/associated sxs/prior Treatment) HPI Comments: Patient is a 53 year old male with history of hepatitis C. Presents with complaints of pain in his right lower leg and left ribs after a fall. He had a titanium rod placed for a right tibia/fibula fracture in July. He fell 3 nights ago and landed on his crutch. Since that time he has had more pain in his right leg and pain in his left ribs. It is worse with breathing and palpation.  Patient is a 53 y.o. male presenting with fall. The history is provided by the patient.  Fall This is a new problem. Episode onset: 3 days ago. The problem occurs constantly. The problem has been gradually worsening. Associated symptoms include chest pain. Pertinent negatives include no shortness of breath. The symptoms are aggravated by walking (movement and palpation). Nothing relieves the symptoms. He has tried nothing for the symptoms. The treatment provided no relief.    Past Medical History  Diagnosis Date  . Depression   . GERD (gastroesophageal reflux disease)   . Hepatitis C    Past Surgical History  Procedure Laterality Date  . Knee surgery    . Foot surgery     No family history on file. History  Substance Use Topics  . Smoking status: Current Every Day Smoker -- 1.00 packs/day  . Smokeless tobacco: Not on file  . Alcohol Use: No    Review of Systems  Respiratory: Negative for shortness of breath.   Cardiovascular: Positive for chest pain.  All other systems reviewed and are negative.     Allergies  Review of patient's allergies indicates no known allergies.  Home Medications   Prior to Admission medications   Medication Sig Start Date End Date Taking? Authorizing Provider  Calcium Carbonate-Vitamin  D (CALCIUM + D PO) Take by mouth.   Yes Historical Provider, MD  benzonatate (TESSALON) 100 MG capsule Take 1 capsule (100 mg total) by mouth every 8 (eight) hours. 03/30/12   Loren Raceravid Yelverton, MD  buPROPion (WELLBUTRIN XL) 150 MG 24 hr tablet Take 150 mg by mouth daily.    Historical Provider, MD  fluticasone (FLONASE) 50 MCG/ACT nasal spray Place 2 sprays into the nose daily. 03/30/12   Loren Raceravid Yelverton, MD  ibuprofen (ADVIL,MOTRIN) 600 MG tablet Take 1 tablet (600 mg total) by mouth every 6 (six) hours as needed for pain. 03/19/12   Derwood KaplanAnkit Nanavati, MD  omeprazole (PRILOSEC) 20 MG capsule Take 20 mg by mouth daily.    Historical Provider, MD  oseltamivir (TAMIFLU) 75 MG capsule Take 1 capsule (75 mg total) by mouth every 12 (twelve) hours. 03/19/12   Derwood KaplanAnkit Nanavati, MD  oxymetazoline (AFRIN NASAL SPRAY) 0.05 % nasal spray Place 2 sprays into the nose 2 (two) times daily. 03/19/12   Derwood KaplanAnkit Nanavati, MD  pantoprazole (PROTONIX) 20 MG tablet Take 1 tablet (20 mg total) by mouth daily. 01/24/12   Hannah Muthersbaugh, PA-C  pantoprazole (PROTONIX) 40 MG tablet Take 1 tablet (40 mg total) by mouth daily. 03/30/12   Loren Raceravid Yelverton, MD  sodium chloride (OCEAN NASAL SPRAY) 0.65 % nasal spray Place 1 spray into the nose as needed for congestion. 03/19/12   Derwood KaplanAnkit Nanavati, MD  sulfacetamide (BLEPH-10) 10 % ophthalmic solution Place 2 drops into both eyes every  4 (four) hours. 02/19/12   Geoffery Lyonsouglas Hermann Dottavio, MD  TRAZODONE HCL PO Take 50 mg by mouth daily as needed. sleep    Historical Provider, MD   BP 138/84 mmHg  Pulse 60  Temp(Src) 98.1 F (36.7 C) (Oral)  Resp 18  Ht 5\' 5"  (1.651 m)  Wt 150 lb (68.04 kg)  BMI 24.96 kg/m2  SpO2 98% Physical Exam  Constitutional: He is oriented to person, place, and time. He appears well-developed and well-nourished. No distress.  HENT:  Head: Normocephalic and atraumatic.  Neck: Normal range of motion. Neck supple.  Cardiovascular: Normal rate, regular rhythm and normal heart  sounds.   No murmur heard. Pulmonary/Chest: Breath sounds normal. No respiratory distress. He has no wheezes. He exhibits tenderness.  There is tenderness to palpation over the left lateral chest wall. There is no palpable abnormality or crepitus. Breath sounds are equal bilaterally.  Abdominal: Soft. Bowel sounds are normal. He exhibits no distension. There is no tenderness.  Musculoskeletal:  The right lower extremity is noted to have trace edema. There is no obvious deformity, bruising, and DP and PT pulses are easily palpable. There is no calf tenderness.  Neurological: He is alert and oriented to person, place, and time.  Skin: Skin is warm and dry. He is not diaphoretic.  Nursing note and vitals reviewed.   ED Course  Procedures (including critical care time) Labs Review Labs Reviewed - No data to display  Imaging Review No results found.   EKG Interpretation None      MDM   Final diagnoses:  Fall    Patient presents after tripping over his crutches and falling. Is complaining of pain in his right leg and left ribs. Rib films revealed what may be a subtle fracture, however x-rays of the right tib-fib do not reveal an acute fracture. The hardware appears intact. He will be discharged to home with pain medication and when necessary return.    Geoffery Lyonsouglas Ramesha Poster, MD 12/30/13 [phone]

## 2013-12-30 NOTE — Discharge Instructions (Signed)
Hydrocodone as prescribed as needed for pain.  Follow-up with your primary Dr. If not improving in the next week.   Rib Fracture A rib fracture is a break or crack in one of the bones of the ribs. The ribs are a group of long, curved bones that wrap around your chest and attach to your spine. They protect your lungs and other organs in the chest cavity. A broken or cracked rib is often painful, but most do not cause other problems. Most rib fractures heal on their own over time. However, rib fractures can be more serious if multiple ribs are broken or if broken ribs move out of place and push against other structures. CAUSES   A direct blow to the chest. For example, this could happen during contact sports, a car accident, or a fall against a hard object.  Repetitive movements with high force, such as pitching a baseball or having severe coughing spells. SYMPTOMS   Pain when you breathe in or cough.  Pain when someone presses on the injured area. DIAGNOSIS  Your caregiver will perform a physical exam. Various imaging tests may be ordered to confirm the diagnosis and to look for related injuries. These tests may include a chest X-ray, computed tomography (CT), magnetic resonance imaging (MRI), or a bone scan. TREATMENT  Rib fractures usually heal on their own in 1-3 months. The longer healing period is often associated with a continued cough or other aggravating activities. During the healing period, pain control is very important. Medication is usually given to control pain. Hospitalization or surgery may be needed for more severe injuries, such as those in which multiple ribs are broken or the ribs have moved out of place.  HOME CARE INSTRUCTIONS   Avoid strenuous activity and any activities or movements that cause pain. Be careful during activities and avoid bumping the injured rib.  Gradually increase activity as directed by your caregiver.  Only take over-the-counter or prescription  medications as directed by your caregiver. Do not take other medications without asking your caregiver first.  Apply ice to the injured area for the first 1-2 days after you have been treated or as directed by your caregiver. Applying ice helps to reduce inflammation and pain.  Put ice in a plastic bag.  Place a towel between your skin and the bag.   Leave the ice on for 15-20 minutes at a time, every 2 hours while you are awake.  Perform deep breathing as directed by your caregiver. This will help prevent pneumonia, which is a common complication of a broken rib. Your caregiver may instruct you to:  Take deep breaths several times a day.  Try to cough several times a day, holding a pillow against the injured area.  Use a device called an incentive spirometer to practice deep breathing several times a day.  Drink enough fluids to keep your urine clear or pale yellow. This will help you avoid constipation.   Do not wear a rib belt or binder. These restrict breathing, which can lead to pneumonia.  SEEK IMMEDIATE MEDICAL CARE IF:   You have a fever.   You have difficulty breathing or shortness of breath.   You develop a continual cough, or you cough up thick or bloody sputum.  You feel sick to your stomach (nausea), throw up (vomit), or have abdominal pain.   You have worsening pain not controlled with medications.  MAKE SURE YOU:  Understand these instructions.  Will watch your condition.  Will get help right away if you are not doing well or get worse. Document Released: 02/14/2005 Document Revised: 10/17/2012 Document Reviewed: 04/18/2012 [facility] Patient Information 2015 [place], [place]. This information is not intended to replace advice given to you by your health care provider. Make sure you discuss any questions you have with your health care provider.

## 2013-12-30 NOTE — ED Notes (Signed)
Patient transported to X-ray 

## 2014-02-15 ENCOUNTER — Emergency Department (HOSPITAL_BASED_OUTPATIENT_CLINIC_OR_DEPARTMENT_OTHER)
Admission: EM | Admit: 2014-02-15 | Discharge: 2014-02-15 | Disposition: A | Discharge status: Home / Self Care | Payer: Self-pay | Attending: Emergency Medicine | Admitting: Emergency Medicine

## 2014-02-15 ENCOUNTER — Emergency Department (HOSPITAL_BASED_OUTPATIENT_CLINIC_OR_DEPARTMENT_OTHER): Payer: Self-pay

## 2014-02-15 ENCOUNTER — Encounter (HOSPITAL_BASED_OUTPATIENT_CLINIC_OR_DEPARTMENT_OTHER): Payer: Self-pay

## 2014-02-15 DIAGNOSIS — T814XXA Infection following a procedure, initial encounter: Secondary | ICD-10-CM | POA: Insufficient documentation

## 2014-02-15 DIAGNOSIS — Y838 Other surgical procedures as the cause of abnormal reaction of the patient, or of later complication, without mention of misadventure at the time of the procedure: Secondary | ICD-10-CM | POA: Insufficient documentation

## 2014-02-15 DIAGNOSIS — Z79899 Other long term (current) drug therapy: Secondary | ICD-10-CM | POA: Insufficient documentation

## 2014-02-15 DIAGNOSIS — Z720 Tobacco use: Secondary | ICD-10-CM | POA: Insufficient documentation

## 2014-02-15 DIAGNOSIS — T8140XA Infection following a procedure, unspecified, initial encounter: Secondary | ICD-10-CM

## 2014-02-15 DIAGNOSIS — L02415 Cutaneous abscess of right lower limb: Secondary | ICD-10-CM | POA: Insufficient documentation

## 2014-02-15 DIAGNOSIS — K219 Gastro-esophageal reflux disease without esophagitis: Secondary | ICD-10-CM | POA: Insufficient documentation

## 2014-02-15 DIAGNOSIS — Z8659 Personal history of other mental and behavioral disorders: Secondary | ICD-10-CM | POA: Insufficient documentation

## 2014-02-15 DIAGNOSIS — Z8619 Personal history of other infectious and parasitic diseases: Secondary | ICD-10-CM | POA: Insufficient documentation

## 2014-02-15 MED ORDER — LIDOCAINE-EPINEPHRINE (PF) 2 %-1:200000 IJ SOLN
10.0000 mL | Freq: Once | INTRAMUSCULAR | Status: AC
  Administered 2014-02-15: 10 mL
  Filled 2014-02-15: qty 20

## 2014-02-15 MED ORDER — DOXYCYCLINE HYCLATE 100 MG PO CAPS: 100.0000 mg | ORAL_CAPSULE | Freq: Two times a day (BID) | ORAL | Status: DC

## 2014-02-15 MED ORDER — DOXYCYCLINE HYCLATE 100 MG PO TABS
100.0000 mg | ORAL_TABLET | Freq: Once | ORAL | Status: AC
  Administered 2014-02-15: 100 mg via ORAL
  Filled 2014-02-15: qty 1

## 2014-02-15 MED ORDER — TETANUS-DIPHTH-ACELL PERTUSSIS 5-2.5-18.5 LF-MCG/0.5 IM SUSP
0.5000 mL | Freq: Once | INTRAMUSCULAR | Status: AC
  Administered 2014-02-15: 0.5 mL via INTRAMUSCULAR
  Filled 2014-02-15: qty 0.5

## 2014-02-15 NOTE — ED Notes (Signed)
[facility]

## 2014-02-15 NOTE — Discharge Instructions (Signed)
Abscess °An abscess (boil or furuncle) is an infected area on or under the skin. This area is filled with yellowish-white fluid (pus) and other material (debris). °HOME CARE  °· Only take medicines as told by your doctor. °· If you were given antibiotic medicine, take it as directed. Finish the medicine even if you start to feel better. °· If gauze is used, follow your doctor's directions for changing the gauze. °· To avoid spreading the infection: °¨ Keep your abscess covered with a bandage. °¨ Wash your hands well. °¨ Do not share personal care items, towels, or whirlpools with others. °¨ Avoid skin contact with others. °· Keep your skin and clothes clean around the abscess. °· Keep all doctor visits as told. °GET HELP RIGHT AWAY IF:  °· You have more pain, puffiness (swelling), or redness in the wound site. °· You have more fluid or blood coming from the wound site. °· You have muscle aches, chills, or you feel sick. °· You have a fever. °MAKE SURE YOU:  °· Understand these instructions. °· Will watch your condition. °· Will get help right away if you are not doing well or get worse. °Document Released: 08/03/2007 Document Revised: 08/16/2011 Document Reviewed: 04/29/2011 °[employer]® Patient Information ©2015 [employer]. This information is not intended to replace advice given to you by your health care provider. Make sure you discuss any questions you have with your health care provider. ° °

## 2014-02-15 NOTE — ED Provider Notes (Signed)
CSN: [id number]     Arrival date & time 02/15/14  [id number] History   First MD Initiated Contact with Patient 02/15/14 [phone]     Chief Complaint  Patient presents with  . Wound Infection     (Consider location/radiation/quality/duration/timing/severity/associated sxs/prior Treatment) HPI 53 year old male presents today complaining of redness and tenderness over the right knee. He states that this began several days ago. Cans a small excoriated area but no definitive trauma. He states he thought that it was a hair follicle that was infected. He has had some picking at it. The redness has grown larger and full. Is not any streaking, fever, or chills. 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 He states he has not had a similar episodes in the past. He denies any other skin lesions, chest pain, or chills. He also has some history of fairly recent orthopedic surgery in this area. He states he had to have some plates placed last spring with a orthopedic doctor at [facility]. Past Medical History  Diagnosis Date  . Depression   . GERD (gastroesophageal reflux disease)   . Hepatitis C    Past Surgical History  Procedure Laterality Date  . Knee surgery    . Foot surgery     No family history on file. History  Substance Use Topics  . Smoking status: Current Every Day Smoker -- 1.00 packs/day  . Smokeless tobacco: Not on file  . Alcohol Use: No    Review of Systems    Allergies  Review of patient's allergies indicates no known allergies.  Home Medications   Prior to Admission medications   Medication Sig Start Date End Date Taking? Authorizing Provider  Calcium Carbonate-Vitamin D (CALCIUM + D PO) Take by mouth.    Historical Provider, MD  omeprazole (PRILOSEC) 20 MG capsule Take 20 mg by mouth daily.    Historical Provider, MD   BP 143/73 mmHg  Pulse 92  Temp(Src) 97.8 F (36.6 C) (Oral)  Resp 18  Ht 5\' 9"  (1.753 m)  Wt 155 lb (70.308 kg)  BMI 22.88 kg/m2  SpO2  99% Physical Exam  Constitutional: He is oriented to person, place, and time. He appears well-developed and well-nourished.  HENT:  Head: Normocephalic and atraumatic.  Right Ear: External ear normal.  Left Ear: External ear normal.  Nose: Nose normal.  Eyes: EOM are normal.  Pulmonary/Chest: Effort normal.  Musculoskeletal: Normal range of motion.       Legs: Tender, erythematous, indurated 3 x 3 cm area the right anterior medial knee. Joint itself does not appear to be inflamed and he has full active range of motion. There are no lesions proximal or distal to this area. There are no lesions noted underneath the nails.  Neurological: He is alert and oriented to person, place, and time.  Skin: Skin is warm and dry.  Psychiatric: He has a normal mood and affect. His behavior is normal.  Nursing note and vitals reviewed.   ED Course  INCISION AND DRAINAGE Date/Time: 02/15/2014 10:32 AM Performed by: Hilario QuarryAY, Videl Nobrega S Authorized by: Hilario QuarryAY, Cyanna Neace S Consent: Verbal consent obtained. Risks and benefits: risks, benefits and alternatives were discussed Consent given by: patient Patient identity confirmed: verbally with patient Time out: Immediately prior to procedure a "time out" was called to verify the correct patient, procedure, equipment, support staff and site/side marked as required. Type: abscess Body area: lower extremity Anesthesia: local infiltration Local anesthetic: lidocaine 1% with epinephrine Anesthetic total: 2 ml  Patient sedated: no Scalpel size: 11 Incision type: single straight Complexity: simple Drainage: purulent Drainage amount: scant Wound treatment: wound left open   (including critical care time) Labs Review Labs Reviewed - No data to display  Imaging Review Dg Knee Complete 4 Views Right  02/15/2014   CLINICAL DATA:  Prior tibial fracture with repair July 2015. Recent anterior right knee pain and redness for 4 days.  EXAM: RIGHT KNEE - COMPLETE 4+  VIEW  COMPARISON:  12/30/2013 tibia/fibula, 10/23/2013 right knee x-Anjalina Bergevin  FINDINGS: No acute fracture or dislocation. The proximal portion of a tibial intramedullary nail is noted without failure or complication. The position is unchanged compared with 12/30/2013. There is no significant joint effusion. There is mild prepatellar soft tissue swelling.  IMPRESSION: No acute osseous injury of the right knee.  Mild prepatellar soft tissue swelling.   Electronically Signed   By: Elige KoHetal  Patel   On: 02/15/2014 09:42     EKG Interpretation None      MDM   Final diagnoses:  Post op infection  Cutaneous abscess of right lower extremity    IND done of abscess on right anterior knee. There does not appear to be any joint involvement. I've cautioned the patient regarding strict return precautions given his underlying hardware and the joint. I've advised him that she should follow-up with his orthopedic surgeon on Monday and return for reevaluation if he has any worsening of his symptoms. He was placed on clindamycin. Epic diagnosis of post op infection pulled in from x-Axelle Szwed- I do not think this is post op although underlying hardware is concerning.  I have discussed this with the patient.     Hilario Quarryanielle S Mckensi Redinger, MD 02/15/14 [phone]

## 2014-08-23 ENCOUNTER — Encounter (HOSPITAL_BASED_OUTPATIENT_CLINIC_OR_DEPARTMENT_OTHER): Payer: Self-pay | Admitting: *Deleted

## 2014-08-23 ENCOUNTER — Emergency Department (HOSPITAL_BASED_OUTPATIENT_CLINIC_OR_DEPARTMENT_OTHER): Payer: Self-pay

## 2014-08-23 ENCOUNTER — Emergency Department (HOSPITAL_BASED_OUTPATIENT_CLINIC_OR_DEPARTMENT_OTHER)
Admission: EM | Admit: 2014-08-23 | Discharge: 2014-08-23 | Disposition: A | Discharge status: Home / Self Care | Payer: Self-pay | Attending: Emergency Medicine | Admitting: Emergency Medicine

## 2014-08-23 DIAGNOSIS — Z9889 Other specified postprocedural states: Secondary | ICD-10-CM | POA: Insufficient documentation

## 2014-08-23 DIAGNOSIS — M7981 Nontraumatic hematoma of soft tissue: Secondary | ICD-10-CM | POA: Insufficient documentation

## 2014-08-23 DIAGNOSIS — R51 Headache: Secondary | ICD-10-CM | POA: Insufficient documentation

## 2014-08-23 DIAGNOSIS — Z8619 Personal history of other infectious and parasitic diseases: Secondary | ICD-10-CM | POA: Insufficient documentation

## 2014-08-23 DIAGNOSIS — M25571 Pain in right ankle and joints of right foot: Secondary | ICD-10-CM | POA: Insufficient documentation

## 2014-08-23 DIAGNOSIS — Z79899 Other long term (current) drug therapy: Secondary | ICD-10-CM | POA: Insufficient documentation

## 2014-08-23 DIAGNOSIS — Z792 Long term (current) use of antibiotics: Secondary | ICD-10-CM | POA: Insufficient documentation

## 2014-08-23 DIAGNOSIS — Z8659 Personal history of other mental and behavioral disorders: Secondary | ICD-10-CM | POA: Insufficient documentation

## 2014-08-23 DIAGNOSIS — Z720 Tobacco use: Secondary | ICD-10-CM | POA: Insufficient documentation

## 2014-08-23 MED ORDER — HYDROCODONE-ACETAMINOPHEN 5-325 MG PO TABS
1.0000 | ORAL_TABLET | Freq: Once | ORAL | Status: AC
  Administered 2014-08-23: 1 via ORAL
  Filled 2014-08-23: qty 1

## 2014-08-23 MED ORDER — HYDROCODONE-ACETAMINOPHEN 5-325 MG PO TABS: 1.0000 | ORAL_TABLET | Freq: Four times a day (QID) | ORAL | Status: DC | PRN

## 2014-08-23 NOTE — ED Notes (Addendum)
H/o ankle fx with repair and hardware. C/o ankle pain & swelling. (denies: fever, nvd, dizziness sob or other sx). Onset yesterday. Pain and swelling located to ankle only. Denies recent injury, no relief with ibuprofen, last at 5 hrs ago. "think hardware may be trying to work its way out".

## 2014-08-23 NOTE — ED Provider Notes (Signed)
CSN: [id number]     Arrival date & time 08/23/14  2016 History  This chart was scribed for Caleb Mulders, MD by Merlene Laughter, ED Scribe. This patient was seen in room MH09/MH09 and the patient's care was started at 9:41 PM.      Chief Complaint  Patient presents with  . Leg Swelling    right   Patient is a 54 y.o. male presenting with ankle pain. The history is provided by the patient. No language interpreter was used.  Ankle Pain Location:  Ankle Time since incident:  1 day Injury: no   Ankle location:  R ankle Pain details:    Quality:  Aching and throbbing   Radiates to:  Does not radiate   Severity:  Moderate   Duration:  1 day   Timing:  Constant   Progression:  Unchanged Foreign body present:  No foreign bodies Prior injury to area:  Yes Relieved by:  Nothing Worsened by:  Bearing weight Associated symptoms: no back pain and no fever     HPI Comments: Caleb Dyer is a 54 y.o. male with prior hx of right ankle repair in July, 2015, and right knee replacement surgery who presents to the Emergency Department complaining of right ankle pain onset last night with associated swelling. Pt rates pain to be 8/10 and describes it to be achy and throbbing. Patient states pain is exacerbated with ambulation. Pt denies any new injuries to affected area. He also has ecchymosis to right lower leg.   Past Medical History  Diagnosis Date  . Depression   . GERD (gastroesophageal reflux disease)   . Hepatitis C    Past Surgical History  Procedure Laterality Date  . Knee surgery    . Foot surgery     No family history on file. History  Substance Use Topics  . Smoking status: Current Every Day Smoker -- 1.00 packs/day  . Smokeless tobacco: Not on file  . Alcohol Use: No    Review of Systems  Constitutional: Negative for fever and chills.  HENT: Negative for rhinorrhea and sore throat.   Eyes: Negative for visual disturbance.  Respiratory: Negative for cough.    Cardiovascular: Positive for leg swelling. Negative for chest pain.  Gastrointestinal: Negative for nausea, vomiting, abdominal pain and diarrhea.  Genitourinary: Negative for dysuria.  Musculoskeletal: Negative for back pain.  Neurological: Positive for headaches.  Hematological: Does not bruise/bleed easily.  Psychiatric/Behavioral: Negative for confusion.   Allergies  Review of patient's allergies indicates no known allergies.  Home Medications   Prior to Admission medications   Medication Sig Start Date End Date Taking? Authorizing Provider  Calcium Carbonate-Vitamin D (CALCIUM + D PO) Take by mouth.    Historical Provider, MD  doxycycline (VIBRAMYCIN) 100 MG capsule Take 1 capsule (100 mg total) by mouth 2 (two) times daily. 02/15/14   Margarita Grizzle, MD  HYDROcodone-acetaminophen (NORCO/VICODIN) 5-325 MG per tablet Take 1-2 tablets by mouth every 6 (six) hours as needed for moderate pain. 08/23/14   Caleb Mulders, MD  omeprazole (PRILOSEC) 20 MG capsule Take 20 mg by mouth daily.    Historical Provider, MD   Triage Vitals: BP 127/85 mmHg  Pulse 90  Temp(Src) 98.2 F (36.8 C) (Oral)  Resp 16  Ht  (1.727 m)  Wt 160 lb (72.576 kg)  BMI 24.33 kg/m2  SpO2 98%  Physical Exam  Constitutional: He is oriented to person, place, and time. He appears well-developed and well-nourished. No distress.  HENT:  Head: Normocephalic and atraumatic.  Mouth/Throat: Oropharynx is clear and moist.  Eyes: Conjunctivae and EOM are normal. Pupils are equal, round, and reactive to light. No scleral icterus.  Eyes track normal  Neck: Neck supple. No tracheal deviation present.  Cardiovascular: Normal rate, regular rhythm and normal heart sounds.   Pulmonary/Chest: Effort normal and breath sounds normal. No respiratory distress.  Abdominal: Bowel sounds are normal. There is no tenderness.  Musculoskeletal: Normal range of motion.  Capillary refill to right great toe 2 sec. No swelling in  right knee. No effusion. PT pulse 2+ No pain over proximal fibula, minimal swelling. No tenderness over achilles tendon No calf tenderness Bruise measureing 1.5x5cm on lateral aspect of right calf  Neurological: He is alert and oriented to person, place, and time. No cranial nerve deficit. He exhibits normal muscle tone. Coordination normal.  Skin: Skin is warm and dry.  Psychiatric: He has a normal mood and affect. His behavior is normal.  Nursing note and vitals reviewed.  ED Course  Procedures  DIAGNOSTIC STUDIES: Oxygen Saturation is 98% on RA, normal by my interpretation.    COORDINATION OF CARE: 9:53 PM- Discussed imaging results with patient. Advised patient to rest, elevate and ice right ankle as needed. Discussed plans to discharge with crutches and medication for pain management.  Advised to follow up if symptoms worsen. Pt advised of plan for treatment and pt agrees.  Labs Review Labs Reviewed - No data to display  Imaging Review Dg Ankle Complete Right  08/23/2014   CLINICAL DATA:  Pain in right ankle with swelling for the past day. Prior surgery in July 2015.  EXAM: RIGHT ANKLE - COMPLETE 3+ VIEW  COMPARISON:  12/30/2013  FINDINGS: The distal portion of an antegrade intramedullary nail in the tibia is noted with 2 distal interlocking screws.  Deformity from the prior distal tibial and fibular shaft fractures observed, with bony bridging. Mild irregularity of the distal tibia fibular articulation.  Tibial plafond and talar dome appear intact. There does appear to be a small tibiotalar joint effusion. Small Achilles calcaneal spur.  IMPRESSION: 1. Interval healing of the distal tibial and fibular fractures with bony bridging. No complicating feature related to the distal portion of the intramedullary nail in the tibia. 2. Tibiotalar joint effusion.   Electronically Signed   By: Gaylyn Rong M.D.   On: 08/23/2014 20:58     EKG Interpretation None      MDM   Final  diagnoses:  Ankle pain, right    Patient was significant injury to the right leg requiring open reduction internal fixation in July 2015. Ligament doing well. Patient started with pain and swelling in the ankle without any new injury. X-ray shows good healing however there is evidence of an effusion. There could be some arthritis developing at the tibia talo joint. I will treat with crutches elevation ice pain medicine and follow-up with his orthopedic doctor in [facility].  I personally performed the services described in this documentation, which was scribed in my presence. The recorded information has been reviewed and is accurate.    Caleb Mulders, MD 08/23/14 2211

## 2014-08-23 NOTE — Discharge Instructions (Signed)
Elevate the right leg is much as possible. Use crutches as needed. Take pain medicine as needed. Continue to ice for the next 2 days. Follow-up with your orthopedic doctor in [facility] area.

## 2014-08-23 NOTE — ED Notes (Signed)
Pain to rt ankle started yesterday , took ibuprofen for pain which usually work yet pain didn't go away and noticed swelling in that joint.

## 2015-11-09 ENCOUNTER — Other Ambulatory Visit: Payer: Self-pay

## 2015-11-09 DIAGNOSIS — B182 Chronic viral hepatitis C: Secondary | ICD-10-CM

## 2015-11-10 LAB — HEPATITIS B CORE ANTIBODY, TOTAL: Hep B Core Total Ab: NONREACTIVE

## 2015-11-10 LAB — PROTIME-INR
INR: 1
PROTHROMBIN TIME: 10.5 s (ref 9.0–11.5)

## 2015-11-10 LAB — HIV ANTIBODY (ROUTINE TESTING W REFLEX): HIV 1&2 Ab, 4th Generation: NONREACTIVE

## 2015-11-10 LAB — HEPATITIS A ANTIBODY, TOTAL: Hep A Total Ab: NONREACTIVE

## 2015-11-10 LAB — AFP TUMOR MARKER: AFP-Tumor Marker: 2.7 ng/mL (ref <6.1)

## 2015-11-10 LAB — HEPATITIS B SURFACE ANTIBODY,QUALITATIVE: HEP B S AB: NEGATIVE

## 2015-11-10 LAB — HEPATITIS B SURFACE ANTIGEN: Hepatitis B Surface Ag: NEGATIVE

## 2015-11-13 LAB — HCV RNA,LIPA RFLX NS5A DRUG RESIST

## 2015-11-16 LAB — HCV RNA NS5A DRUG RESISTANCE: HCV NS5A SUBTYPE: NOT DETECTED

## 2015-12-09 ENCOUNTER — Encounter: Payer: Self-pay | Admitting: Internal Medicine

## 2015-12-09 ENCOUNTER — Ambulatory Visit (INDEPENDENT_AMBULATORY_CARE_PROVIDER_SITE_OTHER): Payer: Self-pay | Admitting: Internal Medicine

## 2015-12-09 ENCOUNTER — Telehealth: Payer: Self-pay | Admitting: *Deleted

## 2015-12-09 VITALS — BP 127/85 | HR 82 | Temp 97.6°F | Ht 69.0 in | Wt 159.0 lb

## 2015-12-09 DIAGNOSIS — B182 Chronic viral hepatitis C: Secondary | ICD-10-CM

## 2015-12-09 DIAGNOSIS — F3289 Other specified depressive episodes: Secondary | ICD-10-CM

## 2015-12-09 DIAGNOSIS — J301 Allergic rhinitis due to pollen: Secondary | ICD-10-CM

## 2015-12-09 DIAGNOSIS — Z23 Encounter for immunization: Secondary | ICD-10-CM

## 2015-12-09 DIAGNOSIS — F1910 Other psychoactive substance abuse, uncomplicated: Secondary | ICD-10-CM | POA: Insufficient documentation

## 2015-12-09 DIAGNOSIS — J302 Other seasonal allergic rhinitis: Secondary | ICD-10-CM | POA: Insufficient documentation

## 2015-12-09 DIAGNOSIS — F329 Major depressive disorder, single episode, unspecified: Secondary | ICD-10-CM | POA: Insufficient documentation

## 2015-12-09 DIAGNOSIS — K219 Gastro-esophageal reflux disease without esophagitis: Secondary | ICD-10-CM

## 2015-12-09 DIAGNOSIS — F32A Depression, unspecified: Secondary | ICD-10-CM | POA: Insufficient documentation

## 2015-12-09 MED ORDER — ELBASVIR-GRAZOPREVIR 50-100 MG PO TABS: 1.0000 | ORAL_TABLET | Freq: Every day | ORAL | 2 refills | Status: AC

## 2015-12-09 NOTE — Progress Notes (Signed)
[facility]   CC: consideration for treatment for chronic hepatitis C  HPI:  +Caleb Dyer is a 55 y.o. male who presents for initial evaluation and management of chronic hepatitis C.  Patient tested positive in the late 1990s. 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 Patient denies renal dialysis, sexual contact with person with liver disease. Patient has had other studies performed. Results: hepatitis C RNA by PCR, result: positive. Patient has not had prior treatment for Hepatitis C. Patient does not have a past history of liver disease. Patient does not have a family history of liver disease. Patient does not  have associated signs or symptoms related to liver disease.  Labs reviewed and confirm chronic hepatitis C with a positive viral load.   Records reviewed from Adult and [facility] and is on 40 mg omeprazole.  Has remained drug free.      Patient does not have documented immunity to Hepatitis A. Patient does not have documented immunity to Hepatitis B.    Review of Systems:   Constitutional: negative for fatigue and malaise Gastrointestinal: negative for diarrhea Musculoskeletal: negative for myalgias and arthralgias All other systems reviewed and are negative      Past Medical History:  Diagnosis Date  . Depression   . GERD (gastroesophageal reflux disease)   . Hepatitis C     Prior to Admission medications   Medication Sig Start Date End Date Taking? Authorizing Provider  buPROPion (WELLBUTRIN) 75 MG tablet Take 75 mg by mouth 2 (two) times daily.   Yes Historical Provider, MD  fluticasone (FLONASE) 50 MCG/ACT nasal spray Place into both nostrils daily.   Yes Historical Provider, MD  loratadine (CLARITIN) 10 MG tablet Take 10 mg by mouth daily.   Yes Historical Provider, MD  meloxicam (MOBIC) 15 MG tablet Take 15 mg by mouth daily.   Yes Historical Provider, MD  omeprazole (PRILOSEC)  20 MG capsule Take 20 mg by mouth daily.   Yes Historical Provider, MD  PARoxetine (PAXIL) 20 MG tablet Take 20 mg by mouth daily.   Yes Historical Provider, MD  traZODone (DESYREL) 100 MG tablet Take 100 mg by mouth at bedtime.   Yes Historical Provider, MD  Elbasvir-Grazoprevir (ZEPATIER) 50-100 MG TABS Take 1 tablet by mouth daily. 12/09/15   Gardiner Barefoot, MD    No Known Allergies  Social History  Substance Use Topics  . Smoking status: Current Every Day Smoker    Packs/day: 1.00  . Smokeless tobacco: Current User  . Alcohol use No    FMHx: no cirrhosis, no liver cancer   Objective:  Constitutional: in no apparent distress,  Vitals:   12/09/15 0959  BP: 127/85  Pulse: 82  Temp: 97.6 F (36.4 C)   Eyes: anicteric Cardiovascular: Cor RRR Respiratory: CTA B; normal respiratory effort Gastrointestinal: Bowel sounds are normal, liver is not enlarged, spleen is not enlarged Musculoskeletal: no pedal edema noted Skin: negatives: no rash; no porphyria cutanea tarda Lymphatic: no cervical lymphadenopathy   Laboratory Genotype: No results found for: HCVGENOTYPE HCV viral load: No results found for: HCVQUANT Lab Results  Component Value Date   WBC 8.0 01/23/2012   HGB 15.6 01/23/2012   HCT 44.7 01/23/2012   MCV 78.1 01/23/2012   PLT 238 01/23/2012    Lab Results  Component Value Date   CREATININE 1.20 01/23/2012   BUN 13 01/23/2012   NA 141 01/23/2012   K 4.6  01/23/2012   CL 105 01/23/2012   CO2 26 01/23/2012    Lab Results  Component Value Date   ALT 13 01/23/2012   AST 20 01/23/2012   ALKPHOS 64 01/23/2012     Labs and history reviewed and show CHILD-PUGH A  5-6 points: Child class A 7-9 points: Child class B 10-15 points: Child class C  Lab Results  Component Value Date   INR 1.0 11/09/2015   BILITOT 0.4 01/23/2012   ALBUMIN 4.2 01/23/2012     Assessment: New Patient with Chronic Hepatitis C genotype 1a, untreated.  I discussed with the  patient the lab findings that confirm chronic hepatitis C as well as the natural history and progression of disease including about 30% of people who develop cirrhosis of the liver if left untreated and once cirrhosis is established there is a 2-7% risk per year of liver cancer and liver failure.  I discussed the importance of treatment and benefits in reducing the risk, even if significant liver fibrosis exists.   Plan: 1) Patient counseled extensively on limiting acetaminophen to no more than 2 grams daily, avoidance of alcohol. 2) Transmission discussed with patient including sexual transmission, sharing razors and toothbrush.   3) Will need referral to gastroenterology if concern for cirrhosis 4) Will need referral for substance abuse counseling: No.; Further work up to include urine drug screen  No. 5) Will prescribe Harvoni for 12 weeks 6) Hepatitis A vaccine Yes.   7) Hepatitis B vaccine Yes.   8) Pneumovax vaccine  9) Further work up to include liver staging with elastography once he gets the [facility] fincancial assistance 10) will follow up after starting medication

## 2015-12-09 NOTE — Telephone Encounter (Signed)
Patient will call me once he obtains the [facility] [employer] letter and I will then be able to schedule his ultrasound elastography.

## 2015-12-14 ENCOUNTER — Encounter: Payer: Self-pay | Admitting: Pharmacy Technician

## 2015-12-24 ENCOUNTER — Encounter: Payer: Self-pay | Admitting: Internal Medicine

## 2015-12-31 ENCOUNTER — Ambulatory Visit: Payer: Self-pay | Admitting: Pharmacist

## 2015-12-31 ENCOUNTER — Other Ambulatory Visit: Payer: Self-pay | Admitting: Pharmacist

## 2015-12-31 DIAGNOSIS — B182 Chronic viral hepatitis C: Secondary | ICD-10-CM

## 2015-12-31 MED ORDER — ONDANSETRON 4 MG PO TBDP: 4.0000 mg | ORAL_TABLET | Freq: Three times a day (TID) | ORAL | 0 refills | Status: AC | PRN

## 2015-12-31 MED ORDER — ONDANSETRON 4 MG PO TBDP: 4.0000 mg | ORAL_TABLET | Freq: Three times a day (TID) | ORAL | 0 refills | Status: DC | PRN

## 2015-12-31 MED FILL — ONDANSETRON ODT 4 MG TABLET: 4 | 7 days supply | Qty: 20 | Fill #0

## 2015-12-31 NOTE — Progress Notes (Signed)
HPI: Caleb Dyer is a 55 y.o. male who presents to the [facility] pharmacy clinic for Hep C follow up.  He has genotype 1a and started Zepatier on 10/17.   No results found for: HCVGENOTYPE, HEPCGENOTYPE  Allergies: No Known Allergies  Past Medical History: Past Medical History:  Diagnosis Date  . Depression   . GERD (gastroesophageal reflux disease)   . Hepatitis C     Social History: Social History   Social History  . Marital status: Single    Spouse name: N/A  . Number of children: N/A  . Years of education: N/A   Social History Main Topics  . Smoking status: Current Every Day Smoker    Packs/day: 1.00  . Smokeless tobacco: Current User  . Alcohol use No  . Drug use: No     Comment: none since 11/2013  . Sexual activity: No   Other Topics Concern  . Not on file   Social History Narrative  . No narrative on file    Labs: Hep B S Ab (no units)  Date Value  11/09/2015 NEG   Hepatitis B Surface Ag (no units)  Date Value  11/09/2015 NEGATIVE    No results found for: HCVGENOTYPE, HEPCGENOTYPE  No flowsheet data found.  AST (U/L)  Date Value  01/23/2012 20  01/23/2012 52 (H)  01/23/2010 32   ALT (U/L)  Date Value  01/23/2012 13  01/23/2012 55 (H)  01/23/2010 30   INR (no units)  Date Value  11/09/2015 1.0    CrCl: CrCl cannot be calculated (Unknown ideal weight.).  Fibrosis Score: Unsure - waiting until patient gets [facility] financial assistance  Child-Pugh Score: Unsure  Previous Treatment Regimen: None  Assessment: Jorja Loaim is here today for follow-up after starting Zepatier for his Hep C.  He reports IV drug use in the 80s and 90s where he shared needles with his girlfriend at the time.  He tells me she told him back then she had Hep C. He has been sober and clean x 2 years this past Friday. I congratulated him.  He is experiencing some side effects on Zepatier. During the first week, he said he had terrible headaches, was really tired, and  nauseated. He does say the fatigue has gotten better. He [facility] the most [facility]. I [facility]fran to [facility] for him to premedicate with before taking Zepatier. He sent in his application for [facility] financial assistance ~2 weeks ago and is still waiting to hear back.  I told him it could take awhile, but to call after about a month or so.  Once he gets the financial assistance, I will bring him back in for a Hep C VL and fibrosure.    Plans: - Continue Zepatier x 12 weeks - Zofran ODT 4 mg q8hr PRN nausea sent in  - Call me when you receive financial assistance so we can get labs  Cassie L. Paulino DoorKuppelweiser, BS, PharmD Infectious Diseases Clinical Pharmacist [facility] 12/31/2015, 10:38 AM

## 2016-02-18 ENCOUNTER — Other Ambulatory Visit: Payer: Self-pay | Admitting: Pharmacist

## 2016-02-18 DIAGNOSIS — B182 Chronic viral hepatitis C: Secondary | ICD-10-CM

## 2016-03-08 ENCOUNTER — Other Ambulatory Visit: Payer: Self-pay

## 2016-03-08 DIAGNOSIS — B182 Chronic viral hepatitis C: Secondary | ICD-10-CM

## 2016-03-10 LAB — LIVER FIBROSIS, FIBROTEST-ACTITEST
ALT: 11 U/L (ref 9–46)
Alpha-2-Macroglobulin: 394 mg/dL — ABNORMAL HIGH (ref 106–279)
Apolipoprotein A1: 117 mg/dL (ref 94–176)
Bilirubin: 0.4 mg/dL (ref 0.2–1.2)
FIBROSIS SCORE: 0.44
GGT: 13 U/L (ref 3–95)
Haptoglobin: 187 mg/dL (ref 43–212)
NECROINFLAMMAT ACT SCORE: 0.04
REFERENCE ID: 1768596

## 2016-03-11 LAB — HEPATITIS C RNA QUANTITATIVE: HCV Quantitative: NOT DETECTED IU/mL (ref <15)

## 2016-03-25 ENCOUNTER — Telehealth: Payer: Self-pay | Admitting: *Deleted

## 2016-03-25 NOTE — Telephone Encounter (Signed)
Patient called for his hep C lab results and advised his viral load is non reactive. He was requesting an appt with MD and Dr. Ephriam Knucklesomer's next available is not until March, so I scheduled him to see a pharmacist on 03/31/16. Caleb Dyer

## 2016-03-31 ENCOUNTER — Ambulatory Visit: Payer: Self-pay

## 2016-04-04 ENCOUNTER — Ambulatory Visit: Payer: Self-pay | Admitting: Pharmacist

## 2016-04-04 DIAGNOSIS — B182 Chronic viral hepatitis C: Secondary | ICD-10-CM

## 2016-04-04 NOTE — Progress Notes (Signed)
HPI: Caleb Dyer is a 56 y.o. male who presents to the [place] clinic today for Hep C f/u.  He finished taking 12 weeks of Zepatier roughly 2 weeks ago.  No results found for: HCVGENOTYPE, HEPCGENOTYPE  Allergies: No Known Allergies  Past Medical History: Past Medical History:  Diagnosis Date  . Depression   . GERD (gastroesophageal reflux disease)   . Hepatitis C     Social History: Social History   Social History  . Marital status: Single    Spouse name: N/A  . Number of children: N/A  . Years of education: N/A   Social History Main Topics  . Smoking status: Current Every Day Smoker    Packs/day: 1.00  . Smokeless tobacco: Current User  . Alcohol use No  . Drug use: No     Comment: none since 11/2013  . Sexual activity: No   Other Topics Concern  . Not on file   Social History Narrative  . No narrative on file    Labs: Hep B S Ab (no units)  Date Value  11/09/2015 NEG   Hepatitis B Surface Ag (no units)  Date Value  11/09/2015 NEGATIVE    No results found for: HCVGENOTYPE, HEPCGENOTYPE  Hepatitis C RNA quantitative Latest Ref Rng & Units 03/08/2016  HCV Quantitative <15 IU/mL Not Detected  HCV Quantitative Log <1.18 log 10 NOT CALC    AST (U/L)  Date Value  01/23/2012 20  01/23/2012 52 (H)  01/23/2010 32   ALT (U/L)  Date Value  03/08/2016 11  01/23/2012 13  01/23/2012 55 (H)  01/23/2010 30   INR (no units)  Date Value  11/09/2015 1.0    CrCl: CrCl cannot be calculated (Patient's most recent lab result is older than the maximum 21 days allowed.).  Fibrosis Score: F1/F2 as assessed by FibroTest   Child-Pugh Score: A  Previous Treatment Regimen: None  Assessment: Caleb Dyer is here for follow-up of his Hep C infection.  He started Zepatier ~12/15/15.  He finished therapy around 2 weeks ago.  He states he never missed a single dose of his medication.  He had nausea when I met with him back in November, which was helped with  the addition of zofran.  We initially did not get a repeat viral load one month into treatment or a fibrosure as he does not have insurance.  He never did get [facility] financial assistance (he states they never would return his calls) but he got the orange card.  He came in on 03/08/16 for a VL and it was undetectable at that time (he only had ~1 week of meds left).  I explained the viral load to him and also his fibrosis score.  He has a rod in his knee and is on Mobic but is asking if he can take Tylenol now intermittently for his knee pain.  I told him that was fine, but try to limit as much as possible. 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 I will make a [id number] lab and pharmacy f/u appointment.    Plans: - Done with treatment - Lab appt 06/20/16 at 1130am - F/u with pharmacy 06/27/16 at [place]. Westley Gambles, PharmD Infectious Diseases [facility] 04/04/2016, 2:04 PM

## 2016-06-20 ENCOUNTER — Other Ambulatory Visit: Payer: Self-pay

## 2016-06-27 ENCOUNTER — Ambulatory Visit: Payer: Self-pay

## 2016-06-30 ENCOUNTER — Ambulatory Visit: Payer: Self-pay

## 2017-08-25 ENCOUNTER — Other Ambulatory Visit: Payer: Self-pay

## 2017-08-25 ENCOUNTER — Emergency Department (HOSPITAL_BASED_OUTPATIENT_CLINIC_OR_DEPARTMENT_OTHER)
Admission: EM | Admit: 2017-08-25 | Discharge: 2017-08-25 | Disposition: A | Discharge status: Home / Self Care | Payer: Self-pay | Attending: Emergency Medicine | Admitting: Emergency Medicine

## 2017-08-25 ENCOUNTER — Encounter (HOSPITAL_BASED_OUTPATIENT_CLINIC_OR_DEPARTMENT_OTHER): Payer: Self-pay | Admitting: Emergency Medicine

## 2017-08-25 ENCOUNTER — Emergency Department (HOSPITAL_BASED_OUTPATIENT_CLINIC_OR_DEPARTMENT_OTHER): Payer: Self-pay

## 2017-08-25 DIAGNOSIS — Z79899 Other long term (current) drug therapy: Secondary | ICD-10-CM | POA: Insufficient documentation

## 2017-08-25 DIAGNOSIS — R519 Headache, unspecified: Secondary | ICD-10-CM

## 2017-08-25 DIAGNOSIS — R51 Headache: Secondary | ICD-10-CM | POA: Insufficient documentation

## 2017-08-25 DIAGNOSIS — F17200 Nicotine dependence, unspecified, uncomplicated: Secondary | ICD-10-CM | POA: Insufficient documentation

## 2017-08-25 MED ORDER — DIPHENHYDRAMINE HCL 50 MG/ML IJ SOLN
25.0000 mg | Freq: Once | INTRAMUSCULAR | Status: AC
  Administered 2017-08-25: 25 mg via INTRAVENOUS
  Filled 2017-08-25: qty 1

## 2017-08-25 MED ORDER — KETOROLAC TROMETHAMINE 30 MG/ML IJ SOLN
15.0000 mg | Freq: Once | INTRAMUSCULAR | Status: AC
  Administered 2017-08-25: 15 mg via INTRAVENOUS
  Filled 2017-08-25: qty 1

## 2017-08-25 MED ORDER — PROCHLORPERAZINE EDISYLATE 10 MG/2ML IJ SOLN
5.0000 mg | Freq: Once | INTRAMUSCULAR | Status: AC
  Administered 2017-08-25: 5 mg via INTRAVENOUS
  Filled 2017-08-25: qty 2

## 2017-08-25 MED ORDER — SODIUM CHLORIDE 0.9 % IV BOLUS
500.0000 mL | Freq: Once | INTRAVENOUS | Status: AC
Start: 2017-08-25 — End: 2017-08-25
  Administered 2017-08-25: 500 mL via INTRAVENOUS

## 2017-08-25 NOTE — Discharge Instructions (Addendum)

## 2017-08-25 NOTE — ED Triage Notes (Signed)
Patient reports headache x 2 days.  Reports nausea, vomiting.  Denies dizziness, ambulatory to triage in NAD

## 2017-08-25 NOTE — ED Provider Notes (Signed)
[facility] EMERGENCY DEPARTMENT Provider Note   CSN: [id number] Arrival date & time: 08/25/17  1726     History   Chief Complaint Chief Complaint  Patient presents with  . Headache    HPI Caleb Dyer is a 57 y.o. male who presents to the ED with cc of HA. Patient states that for the last 2 weeks he has had daily headaches which he describes as global aching and tight. He has never had headaches. He has been using tylenol sinus relief which normally improves or rids him of the headache.Over the past 24 hours his headache has been presistent and unrelenting despite taking otc meds. He has had associated N/V today without photophbia, nuchal rigidity, rash, fever or other neurologic symptoms. He has no previous hx of headaches. He denies grinding teeth. He has been sleeping poorly.  HPI  Past Medical History:  Diagnosis Date  . Depression   . GERD (gastroesophageal reflux disease)   . Hepatitis C     Patient Active Problem List   Diagnosis Date Noted  . Chronic hepatitis C without hepatic coma (HCC) 12/09/2015  . GERD (gastroesophageal reflux disease) 12/09/2015  . Substance abuse (HCC) 12/09/2015  . Depression 12/09/2015  . Seasonal allergies 12/09/2015    Past Surgical History:  Procedure Laterality Date  . FOOT SURGERY    . KNEE SURGERY          Home Medications    Prior to Admission medications   Medication Sig Start Date End Date Taking? Authorizing Provider  buPROPion (WELLBUTRIN) 75 MG tablet Take 75 mg by mouth 2 (two) times daily.    [provider]  Elbasvir-Grazoprevir (ZEPATIER) 50-100 MG TABS Take 1 tablet by mouth daily. 12/09/15   Gardiner Barefootomer, Robert W, MD  fluticasone (FLONASE) 50 MCG/ACT nasal spray Place into both nostrils daily.    [provider]  loratadine (CLARITIN) 10 MG tablet Take 10 mg by mouth daily.    [provider]  meloxicam (MOBIC) 15 MG tablet Take 15 mg by mouth daily.    [provider]   omeprazole (PRILOSEC) 20 MG capsule Take 20 mg by mouth daily.    [provider]  ondansetron (ZOFRAN ODT) 4 MG disintegrating tablet Take 1 tablet (4 mg total) by mouth every 8 (eight) hours as needed for nausea or vomiting. 12/31/15   Gardiner Barefootomer, Robert W, MD  PARoxetine (PAXIL) 20 MG tablet Take 20 mg by mouth daily.    [provider]  traZODone (DESYREL) 100 MG tablet Take 100 mg by mouth at bedtime.    [provider]    Family History History reviewed. No pertinent family history.  Social History Social History   Tobacco Use  . Smoking status: Current Every Day Smoker    Packs/day: 1.00  . Smokeless tobacco: Current User  Substance Use Topics  . Alcohol use: No  . Drug use: No    Comment: none since 11/2013     Allergies   Patient has no known allergies.   Review of Systems Review of Systems  Ten systems reviewed and are negative for acute change, except as noted in the HPI.   Physical Exam Updated Vital Signs BP (!) 173/104 (BP Location: Right Arm)   Pulse 76   Temp 98.4 F (36.9 C) (Oral)   Resp 20   Ht 5\' 9"  (1.753 m)   Wt 74.8 kg (165 lb)   SpO2 96%   BMI 24.37 kg/m   Physical Exam  Constitutional: He is oriented to person, place, and time. He appears well-developed and well-nourished. No distress.  HENT:  Head: Normocephalic and atraumatic.  Mouth/Throat: Oropharynx is clear and moist.  Eyes: Pupils are equal, round, and reactive to light. Conjunctivae and EOM are normal. No scleral icterus.  No horizontal, vertical or rotational nystagmus  Neck: Normal range of motion. Neck supple.  Full active and passive ROM without pain No midline or paraspinal tenderness No nuchal rigidity or meningeal signs  Cardiovascular: Normal rate, regular rhythm and intact distal pulses.  Pulmonary/Chest: Effort normal and breath sounds normal. No respiratory distress. He has no wheezes. He has no rales.  Abdominal: Soft. Bowel sounds are  normal. There is no tenderness. There is no rebound and no guarding.  Musculoskeletal: Normal range of motion.  Lymphadenopathy:    He has no cervical adenopathy.  Neurological: He is alert and oriented to person, place, and time. No cranial nerve deficit. He exhibits normal muscle tone. Coordination normal.  Mental Status:  Alert, oriented, thought content appropriate. Speech fluent without evidence of aphasia. Able to follow 2 step commands without difficulty.  Cranial Nerves:  II:  Peripheral visual fields grossly normal, pupils equal, round, reactive to light III,IV, VI: ptosis not present, extra-ocular motions intact bilaterally  V,VII: smile symmetric, facial light touch sensation equal VIII: hearing grossly normal bilaterally  IX,X: midline uvula rise  XI: bilateral shoulder shrug equal and strong XII: midline tongue extension  Motor:  5/5 in upper and lower extremities bilaterally including strong and equal grip strength and dorsiflexion/plantar flexion Sensory: Pinprick and light touch normal in all extremities.  Cerebellar: normal finger-to-nose with bilateral upper extremities Gait: normal gait and balance CV: distal pulses palpable throughout   Skin: Skin is warm and dry. No rash noted. He is not diaphoretic.  Psychiatric: He has a normal mood and affect. His behavior is normal. Judgment and thought content normal.  Nursing note and vitals reviewed.    ED Treatments / Results  Labs (all labs ordered are listed, but only abnormal results are displayed) Labs Reviewed - No data to display  EKG None  Radiology No results found.  Procedures Procedures (including critical care time)  Medications Ordered in ED Medications  sodium chloride 0.9 % bolus 500 mL (has no administration in time range)  ketorolac (TORADOL) 30 MG/ML injection 15 mg (has no administration in time range)  prochlorperazine (COMPAZINE) injection 5 mg (has no administration in time range)    diphenhydrAMINE (BENADRYL) injection 25 mg (has no administration in time range)     Initial Impression / Assessment and Plan / ED Course  I have reviewed the triage vital signs and the nursing notes.  Pertinent labs & imaging results that were available during my care of the patient were reviewed by me and considered in my medical decision making (see chart for details).     Pt HA treated and improved while in ED.  Presentation is like pts typical HA and non concerning for [facility], ICH, Meningitis, or temporal arteritis. Pt is afebrile with no focal neuro deficits, nuchal rigidity, or change in vision. Pt is to follow up with PCP to discuss prophylactic medication. Pt verbalizes understanding and is agreeable with plan to dc.    Final Clinical Impressions(s) / ED Diagnoses   Final diagnoses:  Bad headache    ED Discharge Orders    None       Arthor Captain, PA-C 08/25/17 2340    Gwyneth Sprout, MD 08/28/17 2037

## 2019-01-03 ENCOUNTER — Other Ambulatory Visit: Payer: Self-pay | Admitting: Nurse Practitioner

## 2019-01-03 DIAGNOSIS — M5412 Radiculopathy, cervical region: Secondary | ICD-10-CM

## 2019-01-08 ENCOUNTER — Ambulatory Visit
Admission: RE | Admit: 2019-01-08 | Discharge: 2019-01-08 | Disposition: A | Discharge status: Home / Self Care | Payer: Medicaid Other | Source: Ambulatory Visit | Attending: Nurse Practitioner | Admitting: Nurse Practitioner

## 2019-01-08 ENCOUNTER — Other Ambulatory Visit: Payer: Self-pay

## 2019-01-08 DIAGNOSIS — M5412 Radiculopathy, cervical region: Secondary | ICD-10-CM

## 2020-07-23 IMAGING — MR MR CERVICAL SPINE W/O CM
4 of 5 series · 25 of 48 positions shown · non-contrast
Comparison: None

CLINICAL DATA: Chronic and persistent neck pain with numbness and
tingling in both arms, left greater than right.

EXAM:
MRI CERVICAL SPINE WITHOUT CONTRAST
TECHNIQUE: Multiplanar, multisequence MR imaging of the cervical spine was
performed. No intravenous contrast was administered.

[Series 3: T2 · sagittal · 3.0mm · 0.41mm/px · 6 of 12 slices shown (1 of 2)]
[im 1/12]
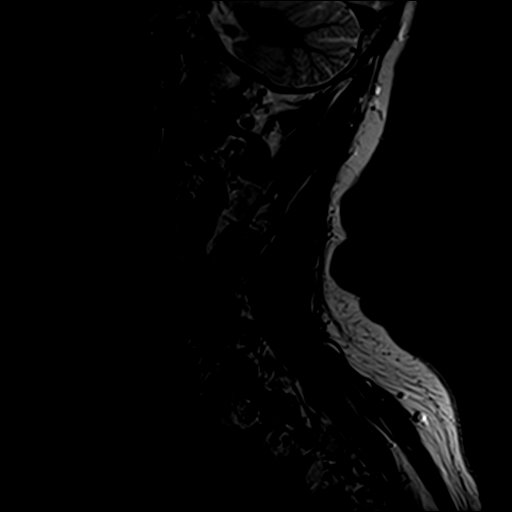
[im 3/12]
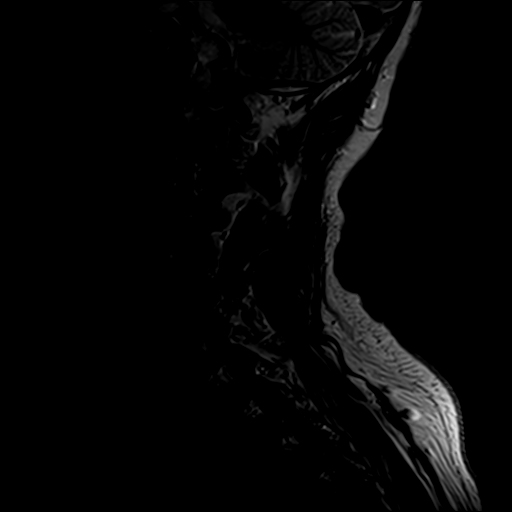
[im 5/12]
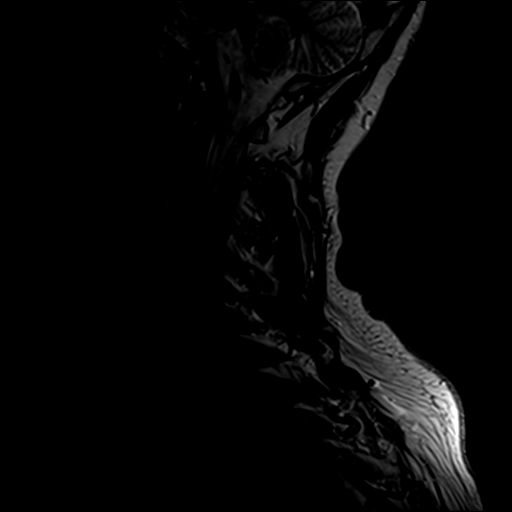
[im 7/12]
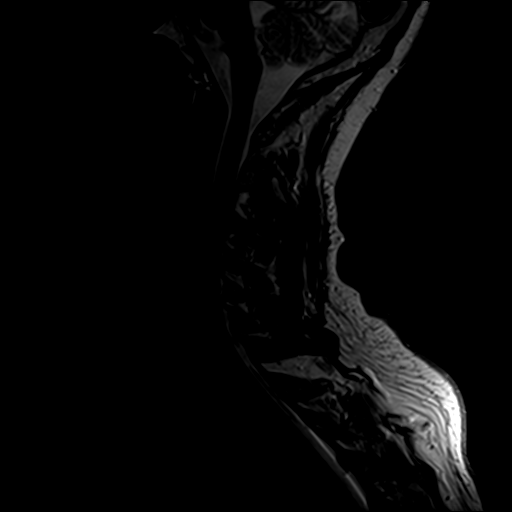
[im 9/12]
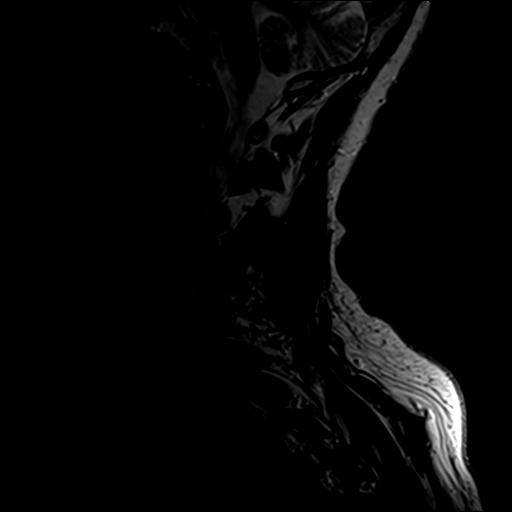
[im 12/12]
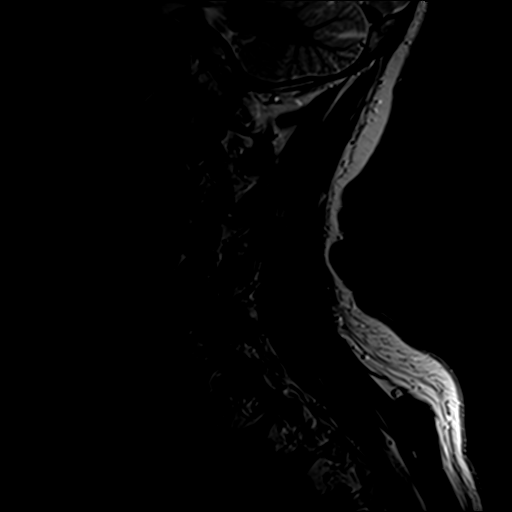

[Series 4: T1 · sagittal · 3.0mm · 0.41mm/px · 7 of 12 slices shown]
[im 1/12]
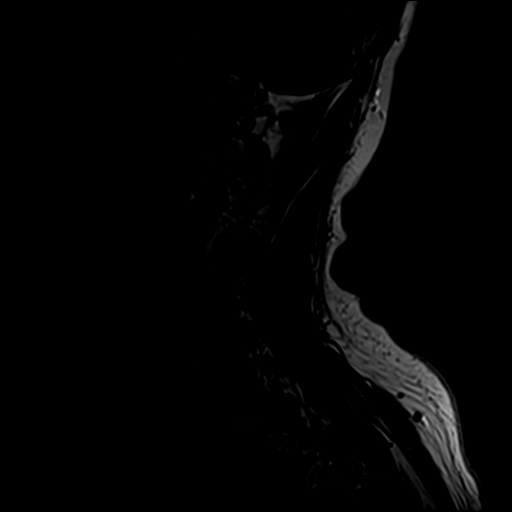
[im 2/12]
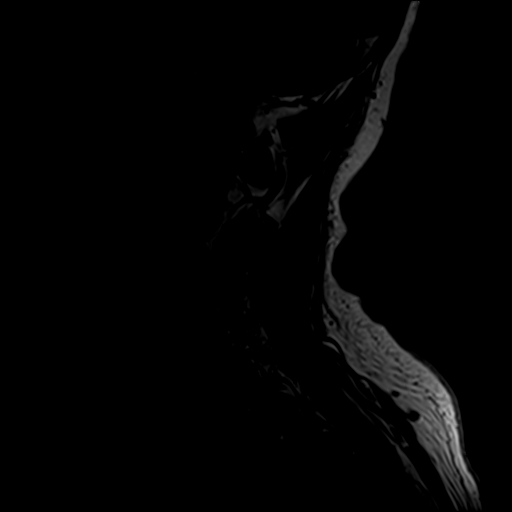
[im 4/12]
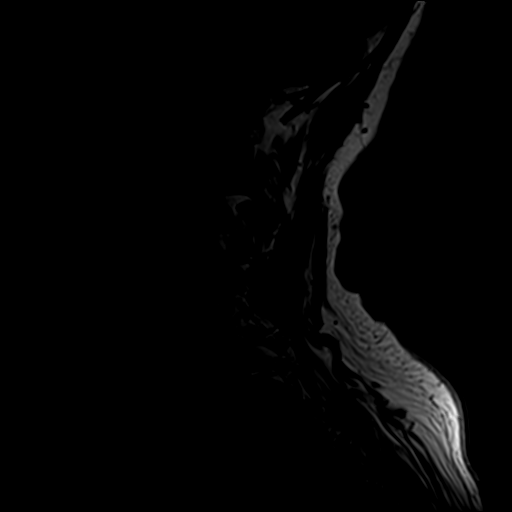
[im 6/12]
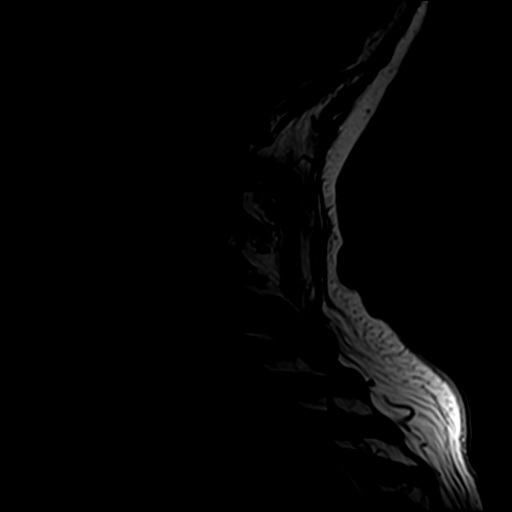
[im 8/12]
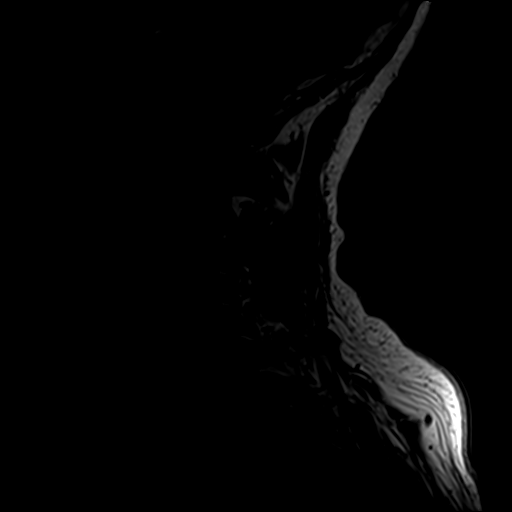
[im 10/12]
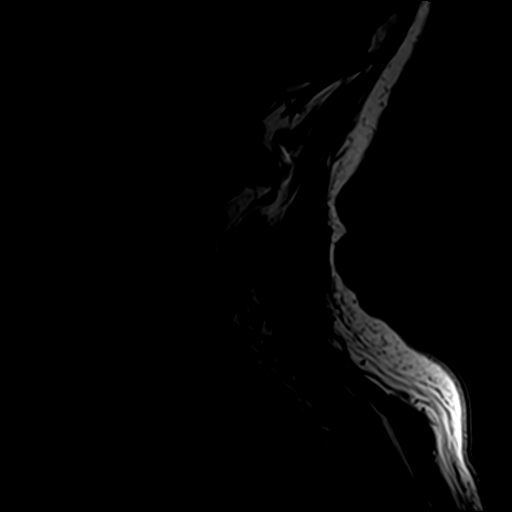
[im 12/12]
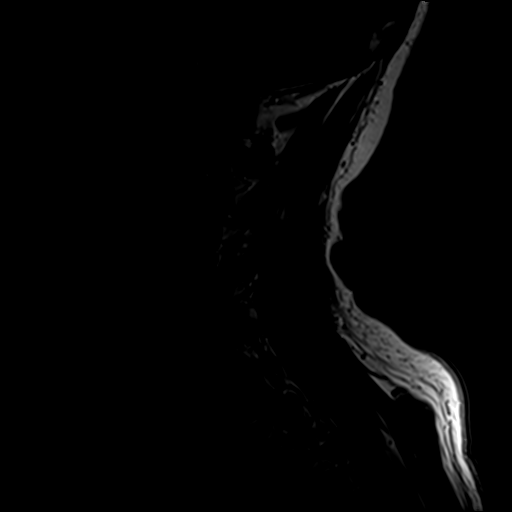

[Series 5: STIR · sagittal · 3.0mm · 0.41mm/px · 4 of 12 slices shown]
[im 1/12]
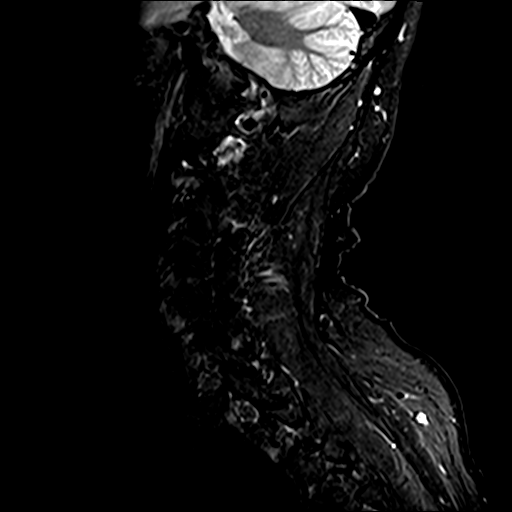
[im 2/12]
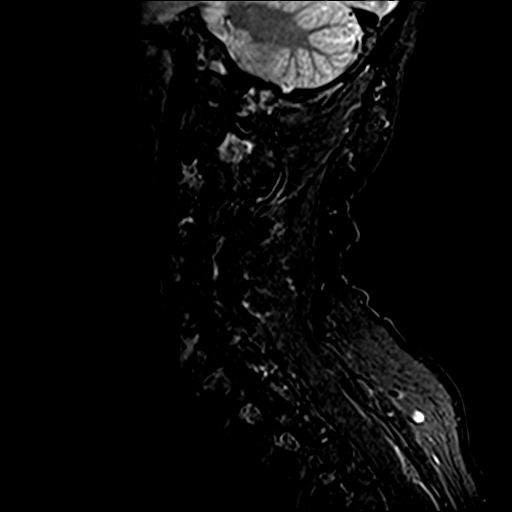
[im 6/12]
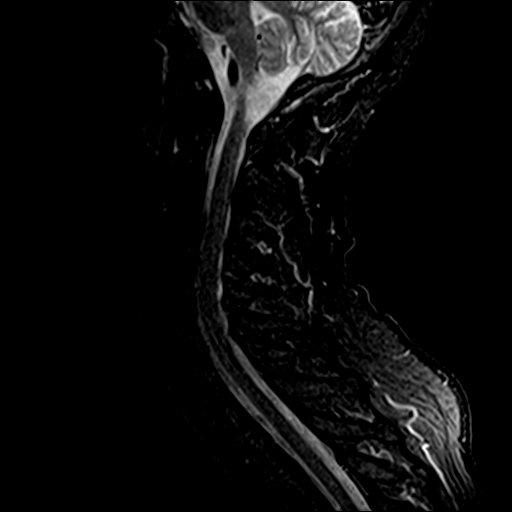
[im 10/12]
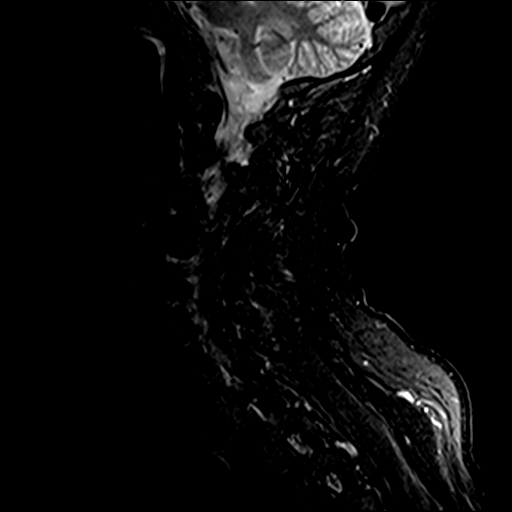

[Series 7: T2 · axial · 3.0mm · 0.74mm/px · z∈[-37,+58]mm · 8 of 25 slices shown (2 of 2)]
[im 1/25]
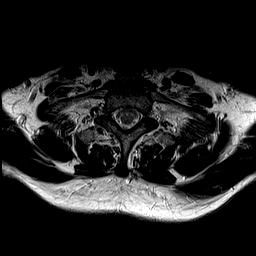
[im 4/25]
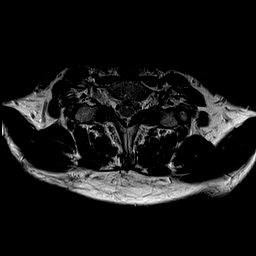
[im 8/25]
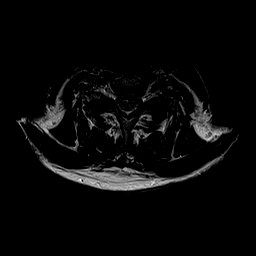
[im 12/25]
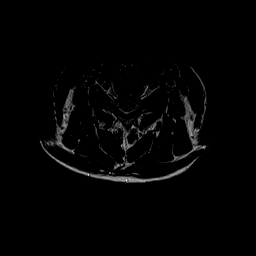
[im 13/25]
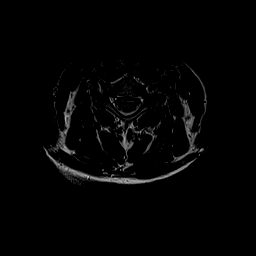
[im 17/25]
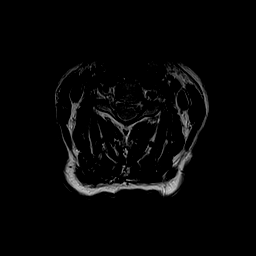
[im 21/25]
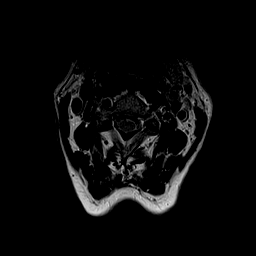
[im 25/25]
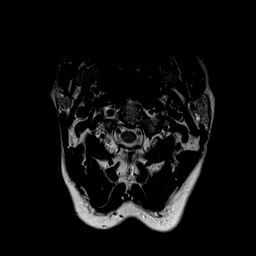

[25 of 48 positions shown; findings below may reference images not displayed]

FINDINGS: Alignment: No vertebral subluxation is observed.

Vertebrae: Mild degenerative endplate findings at C3-4. Disc
desiccation at C3-4 and C4-5. No significant vertebral marrow edema
is identified. Congenitally short pedicles in the cervical spine.

Cord: No significant abnormal spinal cord signal is observed.

Posterior Fossa, vertebral arteries, paraspinal tissues:
Unremarkable

Disc levels:

C2-3: Unremarkable.

C3-4: Prominent left and mild right foraminal stenosis due to facet
and uncinate spurring along with short pedicles. Mild central
narrowing of the thecal sac.

C4-5: Borderline central narrowing of the thecal sac due to disc
bulge and short pedicles.

C5-6: Unremarkable.

C6-7: Unremarkable.

C7-T1: Unremarkable.
IMPRESSION: 1. Congenitally short pedicles along with degenerative disc disease
and spondylosis cause prominent impingement at C3-4 (left greater
than right) and borderline central narrowing of the thecal sac at
C4-5.

## 2022-09-29 DEATH — deceased
